# Patient Record
Sex: Male | Born: 1960 | Race: White | Hispanic: No | Marital: Married | Smoking: Never smoker
Health system: Southern US, Community
[De-identification: ages and names within clinical notes are randomized; demographics above are authoritative.]

## PROBLEM LIST (undated history)

## (undated) DIAGNOSIS — N189 Chronic kidney disease, unspecified: Secondary | ICD-10-CM

## (undated) DIAGNOSIS — K5790 Diverticulosis of intestine, part unspecified, without perforation or abscess without bleeding: Secondary | ICD-10-CM

## (undated) DIAGNOSIS — N138 Other obstructive and reflux uropathy: Secondary | ICD-10-CM

## (undated) DIAGNOSIS — E039 Hypothyroidism, unspecified: Secondary | ICD-10-CM

## (undated) DIAGNOSIS — C61 Malignant neoplasm of prostate: Secondary | ICD-10-CM

## (undated) DIAGNOSIS — I10 Essential (primary) hypertension: Secondary | ICD-10-CM

## (undated) DIAGNOSIS — E119 Type 2 diabetes mellitus without complications: Secondary | ICD-10-CM

## (undated) DIAGNOSIS — Z860100 Personal history of colon polyps, unspecified: Secondary | ICD-10-CM

## (undated) DIAGNOSIS — N401 Enlarged prostate with lower urinary tract symptoms: Secondary | ICD-10-CM

## (undated) DIAGNOSIS — R972 Elevated prostate specific antigen [PSA]: Secondary | ICD-10-CM

## (undated) DIAGNOSIS — R351 Nocturia: Secondary | ICD-10-CM

## (undated) DIAGNOSIS — E785 Hyperlipidemia, unspecified: Secondary | ICD-10-CM

## (undated) HISTORY — PX: COLONOSCOPY: SHX174

## (undated) HISTORY — PX: PROSTATE BIOPSY: SHX241

---

## 2006-06-13 ENCOUNTER — Inpatient Hospital Stay (HOSPITAL_COMMUNITY): Admission: EM | Admit: 2006-06-13 | Discharge: 2006-06-13 | Payer: Self-pay | Admitting: Emergency Medicine
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
## 2018-06-04 DIAGNOSIS — N183 Chronic kidney disease, stage 3 unspecified: Secondary | ICD-10-CM | POA: Insufficient documentation

## 2018-06-04 DIAGNOSIS — E1122 Type 2 diabetes mellitus with diabetic chronic kidney disease: Secondary | ICD-10-CM | POA: Insufficient documentation

## 2018-06-04 DIAGNOSIS — R809 Proteinuria, unspecified: Secondary | ICD-10-CM | POA: Insufficient documentation

## 2022-08-14 ENCOUNTER — Other Ambulatory Visit: Payer: Self-pay | Admitting: Urology

## 2022-08-14 DIAGNOSIS — R972 Elevated prostate specific antigen [PSA]: Secondary | ICD-10-CM

## 2022-09-18 ENCOUNTER — Other Ambulatory Visit: Payer: Self-pay | Admitting: Urology

## 2022-09-18 ENCOUNTER — Ambulatory Visit
Admission: RE | Admit: 2022-09-18 | Discharge: 2022-09-18 | Disposition: A | Discharge status: Home / Self Care | Payer: BC Managed Care – PPO | Source: Ambulatory Visit | Attending: Urology | Admitting: Urology

## 2022-09-18 DIAGNOSIS — R972 Elevated prostate specific antigen [PSA]: Secondary | ICD-10-CM

## 2022-09-18 DIAGNOSIS — T1590XA Foreign body on external eye, part unspecified, unspecified eye, initial encounter: Secondary | ICD-10-CM

## 2022-09-18 MED ORDER — GADOPICLENOL 0.5 MMOL/ML IV SOLN
10.0000 mL | Freq: Once | INTRAVENOUS | Status: AC | PRN
  Administered 2022-09-18: 10 mL via INTRAVENOUS

## 2022-11-10 ENCOUNTER — Telehealth: Payer: Self-pay | Admitting: Internal Medicine

## 2022-11-10 DIAGNOSIS — Z86010 Personal history of colonic polyps: Secondary | ICD-10-CM

## 2022-11-10 NOTE — Telephone Encounter (Signed)
I saw that he was recommended to have a routine repeat colonoscopy in 5 years (which would be this year)  However, no details about last colonoscopy in referral info  We need:  1) copy of last colonoscopy  2) copy of pathology from last colonoscopy  Explain to patient that guidelines have changed and that I want to be sure he needs the colonoscopy based upon most recent guidelines  Once I review the info we can decide when to do next colonoscopy  CEG

## 2022-11-10 NOTE — Telephone Encounter (Signed)
Dr. Leone Payor,  We received a referral for this patient for a colonoscopy.  Last colon was done with Dr. Charm Barges in 2019. (Records under media tab in EPIC).  Please review and advise scheduling.    Thanks Christy  Dr. Leone Payor DOD 11/10/22 AM

## 2022-11-24 ENCOUNTER — Inpatient Hospital Stay
Admission: RE | Admit: 2022-11-24 | Discharge: 2022-11-24 | Disposition: A | Discharge status: Home / Self Care | Payer: Self-pay | Source: Ambulatory Visit | Attending: Radiation Oncology | Admitting: Radiation Oncology

## 2022-11-24 ENCOUNTER — Other Ambulatory Visit: Payer: Self-pay | Admitting: Radiation Oncology

## 2022-11-24 DIAGNOSIS — C61 Malignant neoplasm of prostate: Secondary | ICD-10-CM

## 2022-11-25 ENCOUNTER — Encounter: Payer: Self-pay | Admitting: Urology

## 2022-11-25 ENCOUNTER — Encounter: Payer: Self-pay | Admitting: Radiation Oncology

## 2022-11-25 DIAGNOSIS — C61 Malignant neoplasm of prostate: Secondary | ICD-10-CM | POA: Insufficient documentation

## 2022-11-25 NOTE — Progress Notes (Signed)
GU Location of Tumor / Histology: Prostate Ca  If Prostate Cancer, Gleason Score is (3 + 4) and PSA is (5.2 on 08/08/2022)  Biopsies      10/22/2022 Dr.Roberto Chao CT Abdomen and Pelvis without/with Contrast Clinical Data:  New Diagnosis of prostate cancer.  Staging exam.*  Tracking Code:  BO*     09/18/2022 Dr. Debroah Baller MR Prostate with/without Contrast CLINICAL DATA:  Elevated PSA level of 5.9. Biopsy 06/22/2017 reportedly showed high-grade PIN in the right mid gland and base.  R97.20  IMPRESSION: 1. PI-RADS category 3 lesion of the right anterior transition zone in the mid gland. Targeting data sent to [facility]. 2. Sigmoid colon diverticulosis.   Past/Anticipated interventions by urology, if any: NA  Past/Anticipated interventions by medical oncology, if any: NA  Weight changes, if any: No  IPSS:  2 SHIM:  25  Bowel/Bladder complaints, if any:  No  Nausea/Vomiting, if any: No  Pain issues, if any:  0/10  SAFETY ISSUES: Prior radiation? No Pacemaker/ICD? No Possible current pregnancy? Male Is the patient on methotrexate? No  Current Complaints / other details:  No

## 2022-11-26 ENCOUNTER — Encounter: Payer: Self-pay | Admitting: Radiation Oncology

## 2022-11-26 ENCOUNTER — Other Ambulatory Visit: Payer: Self-pay

## 2022-11-26 ENCOUNTER — Ambulatory Visit
Admission: RE | Admit: 2022-11-26 | Discharge: 2022-11-26 | Disposition: A | Discharge status: Home / Self Care | Payer: BC Managed Care – PPO | Source: Ambulatory Visit | Attending: Radiation Oncology | Admitting: Radiation Oncology

## 2022-11-26 VITALS — Ht 73.0 in | Wt 215.0 lb

## 2022-11-26 DIAGNOSIS — C61 Malignant neoplasm of prostate: Secondary | ICD-10-CM

## 2022-11-26 HISTORY — DX: Hyperlipidemia, unspecified: E78.5

## 2022-11-26 HISTORY — DX: Essential (primary) hypertension: I10

## 2022-11-26 HISTORY — DX: Other obstructive and reflux uropathy: N40.1

## 2022-11-26 HISTORY — DX: Hypothyroidism, unspecified: E03.9

## 2022-11-26 HISTORY — DX: Chronic kidney disease, unspecified: N18.9

## 2022-11-26 HISTORY — DX: Type 2 diabetes mellitus without complications: E11.9

## 2022-11-26 HISTORY — DX: Personal history of colon polyps, unspecified: Z86.0100

## 2022-11-26 HISTORY — DX: Elevated prostate specific antigen (PSA): R97.20

## 2022-11-26 HISTORY — DX: Nocturia: R35.1

## 2022-11-26 HISTORY — DX: Malignant neoplasm of prostate: C61

## 2022-11-26 HISTORY — DX: Diverticulosis of intestine, part unspecified, without perforation or abscess without bleeding: K57.90

## 2022-11-26 HISTORY — DX: Personal history of colonic polyps: Z86.010

## 2022-11-26 HISTORY — DX: Benign prostatic hyperplasia with lower urinary tract symptoms: N13.8

## 2022-11-26 NOTE — Progress Notes (Signed)
Radiation Oncology         [phone] ________________________________  Initial Outpatient Consultation  Name: Micheal Kerr. MRN: [id number]  Date: 11/26/2022  DOB: 01/26/1961  CC:[name], NP  Debroah Baller, MD   REFERRING PHYSICIAN: Debroah Baller, MD  DIAGNOSIS: 62 y.o. gentleman with Stage T1c adenocarcinoma of the prostate with Gleason score of 3+4, and PSA of 5.2.    ICD-10-CM   1. Malignant neoplasm of prostate (HCC)  C61       HISTORY OF PRESENT ILLNESS: Micheal Kerr. is a 62 y.o. male with a diagnosis of prostate cancer. He has a history of elevated, fluctuating PSA since at least 2016 when his PSA was noted at 4.63, and he had a prior prostate biopsy in 06/2017 that was negative, PSA 4.4 at that time. The PSA had remained between 2.5 - 4.7 until 05/2022 when he was noted to have an elevated PSA of 5.93 on routine labs with his primary care provider, Verner Mould, NP.  Accordingly, he was referred for evaluation in urology by Dr. Saddie Benders on 08/08/2022,  digital rectal examination performed at that time showed no discrete nodules or concerning findings.  A repeat PSA that day remained elevated at 5.2 so an MRI prostate was performed on 09/18/22 for further evaluation and this showed a PI-RADS 3 lesion in the right anterior mid gland transitional zone.  He proceeded to MRI fusion transrectal ultrasound with Dr. Arita Miss at [facility] on 10/03/2022.  The prostate volume measured 36 cc.  Out of 15 core biopsies, 7 were positive.  The maximum Gleason score was 3+4, and this was seen in the right mid gland.  Additionally, Gleason 3+3 was seen in the left mid, right base, right base lateral, right apex lateral and 2 out of 3 samples taken from the MRI ROI.  The patient reviewed the biopsy results with his urologist and he has kindly been referred today for discussion of potential radiation treatment options.   PREVIOUS RADIATION THERAPY: No  PAST MEDICAL HISTORY:  Past  Medical History:  Diagnosis Date   BPH with obstruction/lower urinary tract symptoms    Chronic kidney disease    Diabetes mellitus (HCC)    Diverticulosis    Elevated PSA    Hx of colonic polyps    Hyperlipidemia    Hypertension    Hypothyroidism    Nocturia    Prostate cancer (HCC)       PAST SURGICAL HISTORY: Past Surgical History:  Procedure Laterality Date   PROSTATE BIOPSY      FAMILY HISTORY:  Family History  Problem Relation Age of Onset   Lung cancer Mother    Diabetes Father    Diabetes Brother     SOCIAL HISTORY:  Social History   Socioeconomic History   Marital status: Married    Spouse name: Not on file   Number of children: 2   Years of education: Not on file   Highest education level: Not on file  Occupational History   Not on file  Tobacco Use   Smoking status: Never   Smokeless tobacco: Never  Substance and Sexual Activity   Alcohol use: Never   Drug use: Never   Sexual activity: Not on file  Other Topics Concern   Not on file  Social History Narrative   Not on file   Social Determinants of Health   Financial Resource Strain: Not on file  Food Insecurity: No Food Insecurity (11/26/2022)   Hunger  Vital Sign    Worried About Programme researcher, broadcasting/film/video in the Last Year: Never true    Ran Out of Food in the Last Year: Never true  Transportation Needs: No Transportation Needs (11/26/2022)   PRAPARE - Administrator, Civil Service (Medical): No    Lack of Transportation (Non-Medical): No  Physical Activity: Not on file  Stress: Not on file  Social Connections: Not on file  Intimate Partner Violence: Not At Risk (11/26/2022)   Humiliation, Afraid, Rape, and Kick questionnaire    Fear of Current or Ex-Partner: No    Emotionally Abused: No    Physically Abused: No    Sexually Abused: No    ALLERGIES: Patient has no known allergies.  MEDICATIONS:  Current Outpatient Medications  Medication Sig Dispense Refill   finasteride (PROSCAR)  5 MG tablet Take by mouth.     JARDIANCE 25 MG TABS tablet Take by mouth.     levothyroxine (SYNTHROID) 75 MCG tablet Take 1 tablet by mouth daily.     lisinopril (ZESTRIL) 10 MG tablet Take by mouth.     metFORMIN (GLUCOPHAGE) 1000 MG tablet Take 1 tablet by mouth 2 (two) times daily.     MOUNJARO 12.5 MG/0.5ML Pen Inject into the skin.     rosuvastatin (CRESTOR) 10 MG tablet      No current facility-administered medications for this encounter.    REVIEW OF SYSTEMS:  On review of systems, the patient reports that he is doing well overall. He denies any chest pain, shortness of breath, cough, fevers, chills, night sweats, unintended weight changes. He denies any bowel disturbances, and denies abdominal pain, nausea or vomiting. He denies any new musculoskeletal or joint aches or pains. His IPSS was 2, indicating mild urinary symptoms. His SHIM was 25, indicating he does not have erectile dysfunction. A complete review of systems is obtained and is otherwise negative.    PHYSICAL EXAM:  Wt Readings from Last 3 Encounters:  11/26/22 215 lb (97.5 kg)   Temp Readings from Last 3 Encounters:  No data found for Temp   BP Readings from Last 3 Encounters:  No data found for BP   Pulse Readings from Last 3 Encounters:  No data found for Pulse   Pain Assessment Pain Score: 0-No pain/10  Deferred, visit conducted via telephone.    KPS = 100  100 - Normal; no complaints; no evidence of disease. 90   - Able to carry on normal activity; minor signs or symptoms of disease. 80   - Normal activity with effort; some signs or symptoms of disease. 55   - Cares for self; unable to carry on normal activity or to do active work. 60   - Requires occasional assistance, but is able to care for most of his personal needs. 50   - Requires considerable assistance and frequent medical care. 40   - Disabled; requires special care and assistance. 30   - Severely disabled; hospital admission is indicated  although death not imminent. 20   - Very sick; hospital admission necessary; active supportive treatment necessary. 10   - Moribund; fatal processes progressing rapidly. 0     - Dead  Karnofsky DA, Abelmann WH, Craver LS and Burchenal JH [phone]) The use of the nitrogen mustards in the palliative treatment of carcinoma: with particular reference to bronchogenic carcinoma Cancer 1 634-56  LABORATORY DATA:  No results found for: "WBC", "HGB", "HCT", "MCV", "PLT" No results found for: "NA", "K", "CL", "  CO2" No results found for: "ALT", "AST", "GGT", "ALKPHOS", "BILITOT"   RADIOGRAPHY: No results found.    IMPRESSION/PLAN: 1. 63 y.o. gentleman with Stage T1c adenocarcinoma of the prostate with Gleason Score of 3+4, and PSA of 5.2. We discussed the patient's workup and outlined the nature of prostate cancer in this setting. The patient's T stage, Gleason's score, and PSA put him into the favorable intermediate risk group. Accordingly, he is eligible for a variety of potential treatment options including brachytherapy, 5.5 weeks of external radiation, or prostatectomy. We discussed the available radiation techniques, and focused on the details and logistics of delivery.  We discussed and outlined the risks, benefits, short and long-term effects associated with radiotherapy and compared and contrasted these with prostatectomy. We discussed the role of SpaceOAR gel in reducing the rectal toxicity associated with radiotherapy.  He appears to have a good understanding of his disease and our treatment recommendations which are of curative intent.  He was encouraged to ask questions that were answered to his stated satisfaction.  At the conclusion of our conversation, the patient is interested in moving forward with brachytherapy. 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 We will have our scheduling team work with Dr. Cindee Salt to schedule a  brachytherapy OR time.   We personally spent 45 minutes in this encounter including chart review, reviewing radiological studies, meeting face-to-face with the patient, entering orders and completing documentation.    Joyice Faster, PA-C    Margaretmary Dys, MD  [facility]  Radiation Oncology Direct Dial: [phone]  Fax: [phone] [email or web address]  Skype  LinkedIn

## 2022-11-28 ENCOUNTER — Encounter: Payer: Self-pay | Admitting: Radiation Oncology

## 2022-12-10 NOTE — Telephone Encounter (Signed)
Good morning Dr. Leone Payor,   We received the colonoscopy report from 2019, however no pathology report was done with previous colonoscopy. Report from 2016 was also scanned into Media under "[facility] transfer of care Records."  Would you please review and advise on colonoscopy?  Thank you.

## 2022-12-11 ENCOUNTER — Encounter: Payer: Self-pay | Admitting: Internal Medicine

## 2022-12-11 DIAGNOSIS — Z860100 Personal history of colon polyps, unspecified: Secondary | ICD-10-CM | POA: Insufficient documentation

## 2022-12-11 NOTE — Telephone Encounter (Signed)
OK to schedule direct colonoscopy  Encounter Diagnosis  Name Primary?   Hx of colonic polyps Yes

## 2022-12-12 ENCOUNTER — Telehealth: Payer: Self-pay | Admitting: *Deleted

## 2022-12-12 NOTE — Telephone Encounter (Signed)
Left voicemail for patient to call back and schedule colonoscopy.

## 2022-12-12 NOTE — Telephone Encounter (Signed)
Called patient to inform of pre-seed appts. and implant date, spoke with patient and he is aware of these appts. 

## 2022-12-17 ENCOUNTER — Telehealth: Payer: Self-pay | Admitting: *Deleted

## 2022-12-17 NOTE — Telephone Encounter (Signed)
CALLED PATIENT TO REMIND OF PRE-SEED APPTS. FOR 12-18-22, SPOKE WITH PATIENT AND HE IS AWARE OF THESE APPTS.

## 2022-12-17 NOTE — Progress Notes (Signed)
  Radiation Oncology         [phone]) [phone] ________________________________  Name: Micheal Kerr. MRN: [id number]  Date: 12/18/2022  DOB: Jul 15, 1961  SIMULATION AND TREATMENT PLANNING NOTE PUBIC ARCH STUDY  WJ:XBJYN, Orie Rout, NP  Debroah Baller, MD  DIAGNOSIS: 62 y.o. gentleman with Stage T1c adenocarcinoma of the prostate with Gleason score of 3+4, and PSA of 5.2.   Oncology History  Malignant neoplasm of prostate (HCC)  10/03/2022 Cancer Staging   Staging form: Prostate, AJCC 8th Edition - Clinical stage from 10/03/2022: Stage IIB (cT1c, cN0, cM0, PSA: 5.2, Grade Group: 2) - Signed by Marcello Fennel, PA-C on 11/25/2022 Histopathologic type: Adenocarcinoma, NOS Stage prefix: Initial diagnosis Prostate specific antigen (PSA) range: Less than 10 Gleason primary pattern: 3 Gleason secondary pattern: 4 Gleason score: 7 Histologic grading system: 5 grade system Number of biopsy cores examined: 15 Number of biopsy cores positive: 7 Location of positive needle core biopsies: Both sides   11/25/2022 Initial Diagnosis   Malignant neoplasm of prostate (HCC)       ICD-10-CM   1. Malignant neoplasm of prostate (HCC)  C61       COMPLEX SIMULATION:  The patient presented today for evaluation for possible prostate seed implant. He was brought to the radiation planning suite and placed supine on the CT couch. A 3-dimensional image study set was obtained in upload to the planning computer. There, on each axial slice, I contoured the prostate gland. Then, using three-dimensional radiation planning tools I reconstructed the prostate in view of the structures from the transperineal needle pathway to assess for possible pubic arch interference. In doing so, I did not appreciate any pubic arch interference. Also, the patient's prostate volume was estimated based on the drawn structure. The volume was 36 cc.  Given the pubic arch appearance and prostate volume, patient remains a good candidate to  proceed with prostate seed implant. Today, he freely provided informed written consent to proceed.    PLAN: The patient will undergo prostate seed implant.   ________________________________  Artist Pais. Kathrynn Running, M.D.

## 2022-12-17 NOTE — Progress Notes (Signed)
Radiation Oncology         [phone]) [phone] ________________________________  Outpatient Follow up- Pre-seed visit  Name: Micheal Kerr. MRN: [id number]  Date: 12/18/2022  DOB: 14-Sep-1960  CC:[name], NP  Debroah Baller, MD   REFERRING PHYSICIAN: Debroah Baller, MD  DIAGNOSIS: 62 y.o. gentleman with Stage T1c adenocarcinoma of the prostate with Gleason score of 3+4, and PSA of 5.2.     ICD-10-CM   1. Malignant neoplasm of prostate (HCC)  C61       HISTORY OF PRESENT ILLNESS: Micheal Kerr. is a 62 y.o. male with a diagnosis of prostate cancer.  He has a history of elevated, fluctuating PSA since at least 2016 when his PSA was noted at 4.63, and he had a prior prostate biopsy in 06/2017 that was negative, PSA 4.4 at that time. The PSA had remained between 2.5 - 4.7 until 05/2022 when he was noted to have an elevated PSA of 5.93 on routine labs with his primary care provider, Verner Mould, NP.  Accordingly, he was referred for evaluation in urology by Dr. Saddie Benders on 08/08/2022,  digital rectal examination performed at that time showed no discrete nodules or concerning findings.  A repeat PSA that day remained elevated at 5.2 so an MRI prostate was performed on 09/18/22 for further evaluation and this showed a PI-RADS 3 lesion in the right anterior mid gland transitional zone.  He proceeded to MRI fusion transrectal ultrasound with Dr. Arita Miss at [facility] on 10/03/2022.  The prostate volume measured 36 cc.  Out of 15 core biopsies, 7 were positive.  The maximum Gleason score was 3+4, and this was seen in the right mid gland.  Additionally, Gleason 3+3 was seen in the left mid, right base, right base lateral, right apex lateral and 2 out of 3 samples taken from the MRI ROI.   The patient reviewed the biopsy results with his urologist and was kindly referred to [place] for discussion of potential radiation treatment options. We initially met the patient on 11/26/22 and he was most interested  in proceeding with brachytherapy and SpaceOAR gel placement for treatment of his disease. He is here today for his pre-procedure imaging for planning and to answer any additional questions he may have about this treatment.   PREVIOUS RADIATION THERAPY: No  PAST MEDICAL HISTORY:  Past Medical History:  Diagnosis Date   BPH with obstruction/lower urinary tract symptoms    Chronic kidney disease    Diabetes mellitus (HCC)    Diverticulosis    Elevated PSA    Hx of colonic polyps    Hyperlipidemia    Hypertension    Hypothyroidism    Nocturia    Prostate cancer (HCC)       PAST SURGICAL HISTORY: Past Surgical History:  Procedure Laterality Date   PROSTATE BIOPSY      FAMILY HISTORY:  Family History  Problem Relation Age of Onset   Lung cancer Mother    Diabetes Father    Diabetes Brother     SOCIAL HISTORY:  Social History   Socioeconomic History   Marital status: Married    Spouse name: Not on file   Number of children: 2   Years of education: Not on file   Highest education level: Not on file  Occupational History   Not on file  Tobacco Use   Smoking status: Never   Smokeless tobacco: Never  Substance and Sexual Activity   Alcohol use: Never   Drug  use: Never   Sexual activity: Not on file  Other Topics Concern   Not on file  Social History Narrative   Not on file   Social Determinants of Health   Financial Resource Strain: Not on file  Food Insecurity: No Food Insecurity (11/26/2022)   Hunger Vital Sign    Worried About Running Out of Food in the Last Year: Never true    Ran Out of Food in the Last Year: Never true  Transportation Needs: No Transportation Needs (11/26/2022)   PRAPARE - Administrator, Civil Service (Medical): No    Lack of Transportation (Non-Medical): No  Physical Activity: Not on file  Stress: Not on file  Social Connections: Not on file  Intimate Partner Violence: Not At Risk (11/26/2022)   Humiliation, Afraid, Rape, and  Kick questionnaire    Fear of Current or Ex-Partner: No    Emotionally Abused: No    Physically Abused: No    Sexually Abused: No    ALLERGIES: Patient has no known allergies.  MEDICATIONS:  Current Outpatient Medications  Medication Sig Dispense Refill   finasteride (PROSCAR) 5 MG tablet Take by mouth.     JARDIANCE 25 MG TABS tablet Take by mouth.     levothyroxine (SYNTHROID) 75 MCG tablet Take 1 tablet by mouth daily.     lisinopril (ZESTRIL) 10 MG tablet Take by mouth.     metFORMIN (GLUCOPHAGE) 1000 MG tablet Take 1 tablet by mouth 2 (two) times daily.     MOUNJARO 12.5 MG/0.5ML Pen Inject into the skin.     rosuvastatin (CRESTOR) 10 MG tablet      No current facility-administered medications for this visit.    REVIEW OF SYSTEMS:  On review of systems, the patient reports that he is doing well overall. He denies any chest pain, shortness of breath, cough, fevers, chills, night sweats, unintended weight changes. He denies any bowel disturbances, and denies abdominal pain, nausea or vomiting. He denies any new musculoskeletal or joint aches or pains. His IPSS was 2, indicating mild urinary symptoms. His SHIM was 25, indicating he does not have erectile dysfunction. A complete review of systems is obtained and is otherwise negative.  PHYSICAL EXAM:  Wt Readings from Last 3 Encounters:  11/26/22 215 lb (97.5 kg)   Temp Readings from Last 3 Encounters:  No data found for Temp   BP Readings from Last 3 Encounters:  No data found for BP   Pulse Readings from Last 3 Encounters:  No data found for Pulse    /10  In general this is a well appearing Caucasian male in no acute distress. He's alert and oriented x4 and appropriate throughout the examination. Cardiopulmonary assessment is negative for acute distress, and he exhibits normal effort.     KPS = 100  100 - Normal; no complaints; no evidence of disease. 90   - Able to carry on normal activity; minor signs or symptoms of  disease. 80   - Normal activity with effort; some signs or symptoms of disease. 83   - Cares for self; unable to carry on normal activity or to do active work. 60   - Requires occasional assistance, but is able to care for most of his personal needs. 50   - Requires considerable assistance and frequent medical care. 40   - Disabled; requires special care and assistance. 30   - Severely disabled; hospital admission is indicated although death not imminent. 20   - Very  sick; hospital admission necessary; active supportive treatment necessary. 10   - Moribund; fatal processes progressing rapidly. 0     - Dead  Karnofsky DA, Abelmann WH, Craver LS and Burchenal JH [phone]) The use of the nitrogen mustards in the palliative treatment of carcinoma: with particular reference to bronchogenic carcinoma Cancer 1 634-56  LABORATORY DATA:  No results found for: "WBC", "HGB", "HCT", "MCV", "PLT" No results found for: "NA", "K", "CL", "CO2" No results found for: "ALT", "AST", "GGT", "ALKPHOS", "BILITOT"   RADIOGRAPHY: No results found.    IMPRESSION/PLAN: 1. 62 y.o. gentleman with Stage T1c adenocarcinoma of the prostate with Gleason score of 3+4, and PSA of 5.2.  The patient has elected to proceed with seed implant for treatment of his disease. We reviewed the risks, benefits, short and long-term effects associated with brachytherapy and discussed the role of SpaceOAR in reducing the rectal toxicity associated with radiotherapy.  He appears to have a good understanding of his disease and our treatment recommendations which are of curative intent.  He was encouraged to ask questions that were answered to his stated satisfaction. He has freely signed written consent to proceed today in the office and a copy of this document will be placed in his medical record. His procedure is tentatively scheduled for 02/19/23 in collaboration with Dr. Saddie Benders and we will see him back for his post-procedure visit approximately 3  weeks thereafter. We look forward to continuing to participate in his care. He knows that he is welcome to call with any questions or concerns at any time in the interim.  I personally spent 20 minutes in this encounter including chart review, reviewing radiological studies, meeting face-to-face with the patient, entering orders and completing documentation.    Marguarite Arbour, MMS, PA-C [place]  [facility] Radiation Oncology Physician Assistant Direct Dial: [phone]  Fax: [phone]

## 2022-12-18 ENCOUNTER — Other Ambulatory Visit (HOSPITAL_COMMUNITY): Payer: BC Managed Care – PPO

## 2022-12-18 ENCOUNTER — Ambulatory Visit
Admission: RE | Admit: 2022-12-18 | Discharge: 2022-12-18 | Disposition: A | Discharge status: Home / Self Care | Payer: BC Managed Care – PPO | Source: Ambulatory Visit | Attending: Radiation Oncology | Admitting: Radiation Oncology

## 2022-12-18 ENCOUNTER — Ambulatory Visit
Admission: RE | Admit: 2022-12-18 | Discharge: 2022-12-18 | Disposition: A | Discharge status: Home / Self Care | Payer: BC Managed Care – PPO | Source: Ambulatory Visit | Attending: Urology | Admitting: Urology

## 2022-12-18 ENCOUNTER — Encounter (HOSPITAL_COMMUNITY)
Admission: RE | Admit: 2022-12-18 | Discharge: 2022-12-18 | Disposition: A | Discharge status: Home / Self Care | Payer: BC Managed Care – PPO | Source: Ambulatory Visit | Attending: Urology | Admitting: Urology

## 2022-12-18 ENCOUNTER — Encounter: Payer: Self-pay | Admitting: Urology

## 2022-12-18 VITALS — Resp 19 | Ht 73.0 in | Wt 221.0 lb

## 2022-12-18 DIAGNOSIS — C61 Malignant neoplasm of prostate: Secondary | ICD-10-CM

## 2022-12-18 DIAGNOSIS — Z01810 Encounter for preprocedural cardiovascular examination: Secondary | ICD-10-CM | POA: Insufficient documentation

## 2022-12-18 DIAGNOSIS — Z01818 Encounter for other preprocedural examination: Secondary | ICD-10-CM

## 2022-12-18 NOTE — Progress Notes (Signed)
Pre-seed nursing interview for a diagnosis of Stage T1c adenocarcinoma of the prostate with Gleason score of 3+4, and PSA of 5.2.  Patient identity verified x2. Patient reports doing well. No issues conveyed at this time.  Meaningful use complete.  Urinary Management medication(s)- None currently. Urology appointment date- 12/2022, with Dr. Saddie Benders at [facility]  Resp 19   Ht 6\' 1"  (1.854 m)   Wt 221 lb (100.2 kg)   BMI 29.16 kg/m   This concludes the interview.   Ruel Favors, LPN

## 2022-12-24 ENCOUNTER — Encounter: Payer: Self-pay | Admitting: Internal Medicine

## 2023-01-01 ENCOUNTER — Ambulatory Visit (AMBULATORY_SURGERY_CENTER): Payer: BC Managed Care – PPO

## 2023-01-01 ENCOUNTER — Encounter: Payer: Self-pay | Admitting: Internal Medicine

## 2023-01-01 VITALS — Ht 73.0 in | Wt 220.0 lb

## 2023-01-01 DIAGNOSIS — Z86010 Personal history of colonic polyps: Secondary | ICD-10-CM

## 2023-01-01 MED ORDER — NA SULFATE-K SULFATE-MG SULF 17.5-3.13-1.6 GM/177ML PO SOLN
1.0000 | Freq: Once | ORAL | 0 refills | Status: AC
Start: 2023-01-01 — End: 2023-01-01

## 2023-01-01 NOTE — Progress Notes (Signed)
No egg or soy allergy known to patient  No issues known to pt with past sedation with any surgeries or procedures Patient denies ever being told they had issues or difficulty with intubation  No FH of Malignant Hyperthermia Pt is not on diet pills Pt is not on  home 02  Pt is not on blood thinners  Pt denies issues with constipation  No A fib or A flutter Have any cardiac testing pending-- no  LOA: independent   PV completed. Prep instructions reviewed with patient. Help to acess mychart given in the even packet does reach the home address before procedure date. Pt to call the office back if there are any questions. Goodrx coupon for [facility] provided .  Pt instructed to use Singlecare.com or GoodRx for a price reduction on prep if needed   Patient's chart reviewed by Cathlyn Parsons CNRA prior to previsit and patient appropriate for the [facility].  Previsit completed and red dot placed by patient's name on their procedure day (on provider's schedule).

## 2023-01-02 ENCOUNTER — Encounter: Payer: Self-pay | Admitting: Cardiology

## 2023-01-08 ENCOUNTER — Encounter: Payer: Self-pay | Admitting: Certified Registered Nurse Anesthetist

## 2023-01-14 ENCOUNTER — Encounter: Payer: Self-pay | Admitting: Internal Medicine

## 2023-01-14 ENCOUNTER — Ambulatory Visit (AMBULATORY_SURGERY_CENTER): Payer: BC Managed Care – PPO | Admitting: Internal Medicine

## 2023-01-14 VITALS — BP 107/67 | HR 84 | Temp 98.7°F | Resp 9 | Ht 73.0 in | Wt 220.0 lb

## 2023-01-14 DIAGNOSIS — D123 Benign neoplasm of transverse colon: Secondary | ICD-10-CM | POA: Diagnosis not present

## 2023-01-14 DIAGNOSIS — Z09 Encounter for follow-up examination after completed treatment for conditions other than malignant neoplasm: Secondary | ICD-10-CM | POA: Diagnosis not present

## 2023-01-14 DIAGNOSIS — Z86010 Personal history of colonic polyps: Secondary | ICD-10-CM

## 2023-01-14 MED ORDER — SODIUM CHLORIDE 0.9 % IV SOLN
500.0000 mL | Freq: Once | INTRAVENOUS | Status: DC
Start: 2023-01-14 — End: 2023-01-14

## 2023-01-14 NOTE — Op Note (Signed)
[facility] Patient Name: Micheal Kerr Procedure Date: 01/14/2023 1:40 PM MRN: [id number] Endoscopist: Iva Boop , MD, [id number] Age: 62 Referring MD:  Date of Birth: [birth year] Gender: Male Account #: [id number] Procedure:                Colonoscopy Indications:              Surveillance: Personal history of adenomatous                            polyps on last colonoscopy 5 years ago, Last                            colonoscopy: 2019 Medicines:                Monitored Anesthesia Care Procedure:                Pre-Anesthesia Assessment:                           - Prior to the procedure, a History and Physical                            was performed, and patient medications and                            allergies were reviewed. The patient's tolerance of                            previous anesthesia was also reviewed. The risks                            and benefits of the procedure and the sedation                            options and risks were discussed with the patient.                            All questions were answered, and informed consent                            was obtained. Prior Anticoagulants: The patient has                            taken no anticoagulant or antiplatelet agents. ASA                            Grade Assessment: II - A patient with mild systemic                            disease. After reviewing the risks and benefits,                            the patient was deemed in satisfactory condition to  undergo the procedure.                           After obtaining informed consent, the colonoscope                            was passed under direct vision. Throughout the                            procedure, the patient's blood pressure, pulse, and                            oxygen saturations were monitored continuously. The                            CF [id number] #[id number] was introduced through the  anus                            and advanced to the the cecum, identified by                            appendiceal orifice and ileocecal valve. The                            colonoscopy was performed without difficulty. The                            patient tolerated the procedure well. The quality                            of the bowel preparation was good. The ileocecal                            valve, appendiceal orifice, and rectum were                            photographed. The bowel preparation used was SUPREP                            via split dose instruction. Scope In: 2:03:02 PM Scope Out: 2:15:05 PM Scope Withdrawal Time: 0 hours 8 minutes 15 seconds  Total Procedure Duration: 0 hours 12 minutes 3 seconds  Findings:                 The perianal and digital rectal examinations were                            normal. Pertinent negatives include normal prostate                            (size, shape, and consistency).                           Four sessile polyps were found in the transverse  colon. The polyps were diminutive in size. These                            polyps were removed with a cold snare. Resection                            and retrieval were complete. Verification of                            patient identification for the specimen was done.                            Estimated blood loss was minimal.                           Multiple diverticula were found in the sigmoid                            colon.                           The exam was otherwise without abnormality on                            direct and retroflexion views. Complications:            No immediate complications. Estimated Blood Loss:     Estimated blood loss was minimal. Impression:               - Four diminutive polyps in the transverse colon,                            removed with a cold snare. Resected and retrieved.                           -  Diverticulosis in the sigmoid colon.                           - The examination was otherwise normal on direct                            and retroflexion views.                           - Personal history of colonic polyps. Multiple some                            > 10 mm 2016 (no path report) and no polyps 2019 Recommendation:           - Patient has a contact number available for                            emergencies. The signs and symptoms of potential  delayed complications were discussed with the                            patient. Return to normal activities tomorrow.                            Written discharge instructions were provided to the                            patient.                           - Resume previous diet.                           - Continue present medications.                           - Repeat colonoscopy is recommended for                            surveillance. The colonoscopy date will be                            determined after pathology results from today's                            exam become available for review. Iva Boop, MD 01/14/2023 2:26:11 PM This report has been signed electronically.

## 2023-01-14 NOTE — Progress Notes (Signed)
Report given to PACU, vss 

## 2023-01-14 NOTE — Progress Notes (Signed)
[facility] History and Physical   Primary Care Physician:  [name], NP   Reason for Procedure:   Hx colon polyps  Plan:    colonoscopy     HPI: Micheal Kerr. is a 62 y.o. male here for a surveillance colonoscopy  2016 - Charm Barges multiple some > 10 mm - no path report Neg exam 2019  Past Medical History:  Diagnosis Date   BPH with obstruction/lower urinary tract symptoms    Chronic kidney disease    Diabetes mellitus (HCC)    Diverticulosis    Elevated PSA    Hx of colonic polyps    Hyperlipidemia    Hypertension    Hypothyroidism    Nocturia    Prostate cancer (HCC)     Past Surgical History:  Procedure Laterality Date   COLONOSCOPY     PROSTATE BIOPSY      Prior to Admission medications   Medication Sig Start Date End Date Taking? Authorizing Provider  JARDIANCE 25 MG TABS tablet Take by mouth. 04/27/18  Yes [provider]  levothyroxine (SYNTHROID) 88 MCG tablet Take 88 mcg by mouth every morning. 12/16/22  Yes [provider]  losartan (COZAAR) 25 MG tablet Take 25 mg by mouth daily. 12/12/22  Yes [provider]  metFORMIN (GLUCOPHAGE) 1000 MG tablet Take 1 tablet by mouth 2 (two) times daily. 04/23/18  Yes [provider]  rosuvastatin (CRESTOR) 10 MG tablet  08/30/22  Yes [provider]  MOUNJARO 12.5 MG/0.5ML Pen Inject 12.5 mg into the skin once a week. 09/16/22   [provider]    Current Outpatient Medications  Medication Sig Dispense Refill   JARDIANCE 25 MG TABS tablet Take by mouth.     levothyroxine (SYNTHROID) 88 MCG tablet Take 88 mcg by mouth every morning.     losartan (COZAAR) 25 MG tablet Take 25 mg by mouth daily.     metFORMIN (GLUCOPHAGE) 1000 MG tablet Take 1 tablet by mouth 2 (two) times daily.     rosuvastatin (CRESTOR) 10 MG tablet      MOUNJARO 12.5 MG/0.5ML Pen Inject 12.5 mg into the skin once a week.     Current Facility-Administered Medications   Medication Dose Route Frequency Provider Last Rate Last Admin   0.9 %  sodium chloride infusion  500 mL Intravenous Once Iva Boop, MD        Allergies as of 01/14/2023   (No Known Allergies)    Family History  Problem Relation Age of Onset   Lung cancer Mother    Diabetes Father    Diabetes Brother    Colon cancer Neg Hx    Colon polyps Neg Hx    Esophageal cancer Neg Hx    Rectal cancer Neg Hx    Stomach cancer Neg Hx     Social History   Socioeconomic History   Marital status: Married    Spouse name: Not on file   Number of children: 2   Years of education: Not on file   Highest education level: Not on file  Occupational History   Not on file  Tobacco Use   Smoking status: Never   Smokeless tobacco: Never  Vaping Use   Vaping Use: Never used  Substance and Sexual Activity   Alcohol use: Never   Drug use: Never   Sexual activity: Not on file  Other Topics Concern   Not on file  Social History Narrative   Not on  file   Social Determinants of Health   Financial Resource Strain: Not on file  Food Insecurity: No Food Insecurity (11/26/2022)   Hunger Vital Sign    Worried About Running Out of Food in the Last Year: Never true    Ran Out of Food in the Last Year: Never true  Transportation Needs: No Transportation Needs (11/26/2022)   PRAPARE - Administrator, Civil Service (Medical): No    Lack of Transportation (Non-Medical): No  Physical Activity: Not on file  Stress: Not on file  Social Connections: Not on file  Intimate Partner Violence: Not At Risk (11/26/2022)   Humiliation, Afraid, Rape, and Kick questionnaire    Fear of Current or Ex-Partner: No    Emotionally Abused: No    Physically Abused: No    Sexually Abused: No    Review of Systems:  All other review of systems negative except as mentioned in the HPI.  Physical Exam: Vital signs BP 119/75   Pulse 87   Temp 98.7 F (37.1 C)   Ht 6\' 1"  (1.854 m)   Wt 220 lb (99.8 kg)    SpO2 96%   BMI 29.03 kg/m   General:   Alert,  Well-developed, well-nourished, pleasant and cooperative in NAD Lungs:  Clear throughout to auscultation.   Heart:  Regular rate and rhythm; no murmurs, clicks, rubs,  or gallops. Abdomen:  Soft, nontender and nondistended. Normal bowel sounds.   Neuro/Psych:  Alert and cooperative. Normal mood and affect. A and O x 3   @Yelena Metzer  Sena Slate, MD, [facility] [phone] (pager) 01/14/2023 1:48 PM@

## 2023-01-14 NOTE — Progress Notes (Signed)
Called to room to assist during endoscopic procedure.  Patient ID and intended procedure confirmed with present staff. Received instructions for my participation in the procedure from the performing physician.  

## 2023-01-14 NOTE — Patient Instructions (Addendum)
I found and removed 4 tiny polyps. I will let you know pathology results and when to have another routine colonoscopy by mail and/or My Chart.  You also have diverticulosis - a common condition that is not usually a problem.  I appreciate the opportunity to care for you. Iva Boop, MD, [facility]  Handouts Provided:  Polyps and Diverticulosis  YOU HAD AN ENDOSCOPIC PROCEDURE TODAY AT THE [facility]:   Refer to the procedure report that was given to you for any specific questions about what was found during the examination.  If the procedure report does not answer your questions, please call your gastroenterologist to clarify.  If you requested that your care partner not be given the details of your procedure findings, then the procedure report has been included in a sealed envelope for you to review at your convenience later.  YOU SHOULD EXPECT: Some feelings of bloating in the abdomen. Passage of more gas than usual.  Walking can help get rid of the air that was put into your GI tract during the procedure and reduce the bloating. If you had a lower endoscopy (such as a colonoscopy or flexible sigmoidoscopy) you may notice spotting of blood in your stool or on the toilet paper. If you underwent a bowel prep for your procedure, you may not have a normal bowel movement for a few days.  Please Note:  You might notice some irritation and congestion in your nose or some drainage.  This is from the oxygen used during your procedure.  There is no need for concern and it should clear up in a day or so.  SYMPTOMS TO REPORT IMMEDIATELY:  Following lower endoscopy (colonoscopy or flexible sigmoidoscopy):  Excessive amounts of blood in the stool  Significant tenderness or worsening of abdominal pains  Swelling of the abdomen that is new, acute  Fever of 100F or higher  For urgent or emergent issues, a gastroenterologist can be reached at any hour by calling [phone]. Do not use  MyChart messaging for urgent concerns.    DIET:  We do recommend a small meal at first, but then you may proceed to your regular diet.  Drink plenty of fluids but you should avoid alcoholic beverages for 24 hours.  ACTIVITY:  You should plan to take it easy for the rest of today and you should NOT DRIVE or use heavy machinery until tomorrow (because of the sedation medicines used during the test).    FOLLOW UP: Our staff will call the number listed on your records the next business day following your procedure.  We will call around 7:15- 8:00 am to check on you and address any questions or concerns that you may have regarding the information given to you following your procedure. If we do not reach you, we will leave a message.     If any biopsies were taken you will be contacted by phone or by letter within the next 1-3 weeks.  Please call us at [phone] if you have not heard about the biopsies in 3 weeks.    SIGNATURES/CONFIDENTIALITY: You and/or your care partner have signed paperwork which will be entered into your electronic medical record.  These signatures attest to the fact that that the information above on your After Visit Summary has been reviewed and is understood.  Full responsibility of the confidentiality of this discharge information lies with you and/or your care-partner.

## 2023-01-14 NOTE — Progress Notes (Signed)
Pt's states no medical or surgical changes since previsit or office visit. 

## 2023-01-15 ENCOUNTER — Telehealth: Payer: Self-pay | Admitting: *Deleted

## 2023-01-15 NOTE — Telephone Encounter (Signed)
Post procedure follow up phone call. No answer at number given.  Left message on voicemail.  

## 2023-01-26 ENCOUNTER — Encounter: Payer: Self-pay | Admitting: Internal Medicine

## 2023-01-29 ENCOUNTER — Other Ambulatory Visit: Payer: Self-pay

## 2023-01-29 ENCOUNTER — Encounter (HOSPITAL_BASED_OUTPATIENT_CLINIC_OR_DEPARTMENT_OTHER): Payer: Self-pay | Admitting: Urology

## 2023-01-29 NOTE — Progress Notes (Signed)
Spoke w/ via phone for pre-op interview: patient  Lab needs dos: I-Stat  Lab results: EKG 12/18/22 COVID test: patient states asymptomatic no test needed. Arrive at 1130 NPO after MN except clear liquids. Clear liquids from MN until 1030. Med rec completed. Medications to take morning of surgery: levothyroxine Diabetic medication: none AM of sx; hold Mounjaro 1 week prior to sx Patient instructed no nail polish to be worn day of surgery. Patient instructed to bring photo id and insurance card day of surgery. Patient aware to have driver (ride ) / caregiver for 24 hours after surgery. wife to drive. Special Instructions: NA Patient verbalized understanding of instructions that were given at this phone interview. Patient denies shortness of breath, chest pain, fever, cough at this phone interview.  Patient PCP: Dr. Drusilla Kanner, [facility]

## 2023-02-11 ENCOUNTER — Ambulatory Visit: Payer: BC Managed Care – PPO | Attending: Cardiology | Admitting: Cardiology

## 2023-02-11 ENCOUNTER — Encounter: Payer: Self-pay | Admitting: Cardiology

## 2023-02-11 VITALS — BP 112/64 | HR 91 | Ht 73.0 in | Wt 225.6 lb

## 2023-02-11 DIAGNOSIS — E1122 Type 2 diabetes mellitus with diabetic chronic kidney disease: Secondary | ICD-10-CM | POA: Diagnosis not present

## 2023-02-11 DIAGNOSIS — R0609 Other forms of dyspnea: Secondary | ICD-10-CM | POA: Insufficient documentation

## 2023-02-11 DIAGNOSIS — N183 Chronic kidney disease, stage 3 unspecified: Secondary | ICD-10-CM

## 2023-02-11 DIAGNOSIS — Z8249 Family history of ischemic heart disease and other diseases of the circulatory system: Secondary | ICD-10-CM

## 2023-02-11 DIAGNOSIS — R0989 Other specified symptoms and signs involving the circulatory and respiratory systems: Secondary | ICD-10-CM | POA: Diagnosis not present

## 2023-02-11 DIAGNOSIS — C61 Malignant neoplasm of prostate: Secondary | ICD-10-CM

## 2023-02-11 DIAGNOSIS — I129 Hypertensive chronic kidney disease with stage 1 through stage 4 chronic kidney disease, or unspecified chronic kidney disease: Secondary | ICD-10-CM

## 2023-02-11 NOTE — Patient Instructions (Signed)
Medication Instructions:  Your physician recommends that you continue on your current medications as directed. Please refer to the Current Medication list given to you today.  *If you need a refill on your cardiac medications before your next appointment, please call your pharmacy*   Lab Work: None Ordered If you have labs (blood work) drawn today and your tests are completely normal, you will receive your results only by: MyChart Message (if you have MyChart) OR A paper copy in the mail If you have any lab test that is abnormal or we need to change your treatment, we will call you to review the results.   Testing/Procedures: Your physician has requested that you have a carotid duplex. This test is an ultrasound of the carotid arteries in your neck. It looks at blood flow through these arteries that supply the brain with blood. Allow one hour for this exam. There are no restrictions or special instructions.   We will order CT coronary calcium score. It will cost $99.00 and is not covered by insurance.  Please call to schedule.    [facility] [address] [place], [place] [address] [phone]    Follow-Up: At [facility], you and your health needs are our priority.  As part of our continuing mission to provide you with exceptional heart care, we have created designated Provider Care Teams.  These Care Teams include your primary Cardiologist (physician) and Advanced Practice Providers (APPs -  Physician Assistants and Nurse Practitioners) who all work together to provide you with the care you need, when you need it.  We recommend signing up for the patient portal called "MyChart".  Sign up information is provided on this After Visit Summary.  MyChart is used to connect with patients for Virtual Visits (Telemedicine).  Patients are able to view lab/test results, encounter notes, upcoming appointments, etc.  Non-urgent messages can be sent to your provider as well.   To  learn more about what you can do with MyChart, go to [email or web address].    Your next appointment:   2 month(s)  The format for your next appointment:   In Person  Provider:   Gypsy Balsam, MD    Other Instructions NA

## 2023-02-11 NOTE — Addendum Note (Signed)
Addended by: Baldo Ash D on: 02/11/2023 03:22 PM   Modules accepted: Orders

## 2023-02-11 NOTE — Progress Notes (Signed)
Cardiology Consultation:    Date:  02/11/2023   ID:  Micheal Kerr., DOB August 13, 1960, MRN [id number]  PCP:  [name], NP  Cardiologist:  Gypsy Balsam, MD   Referring MD: [name], NP   Chief Complaint  Patient presents with   Poor leg circulation    History of Present Illness:    Micheal Cypress. is a 62 y.o. male who is being seen today for the evaluation of poor circulation in his legs at the request of [name], NP.  Past medical history significant for longstanding diabetes, 10 years his hemoglobin A1c is in the neighborhood of 7, chronic kidney disease, benign prostate hypertrophy and now recently diagnosed with prostate cancer, hyperlipidemia, does not smoke.  He was referred to [place] because he got some sophisticated test done in his lower extremities he was told to have a circulation problem with a lot of numbers that they look to ABIs however are normal.  He said he does not have any problem he can walk climb stairs with no difficulty meaning he does not have any signs and symptoms of claudication pulses distally and fairly distant.  He never had any heart trouble.  Denies having any chest pain tightness squeezing pressure burning chest.  He has been treated with statin for his hyperlipidemia.  He does not have family history of premature coronary artery disease in the matter of fact he does not have any cardiac history in his family usually his family have appropriate cancer.  He is not on any special diet he does not exercise on a regular basis but is very active  Past Medical History:  Diagnosis Date   BPH with obstruction/lower urinary tract symptoms    Chronic kidney disease    Diabetes mellitus (HCC)    Diverticulosis    Elevated PSA    Hx of colonic polyps    Hyperlipidemia    Hypertension    Hypothyroidism    Nocturia    Prostate cancer (HCC)     Past Surgical History:  Procedure Laterality Date   COLONOSCOPY     PROSTATE BIOPSY       Current Medications: Current Meds  Medication Sig   JARDIANCE 25 MG TABS tablet Take 25 mg by mouth daily.   levothyroxine (SYNTHROID) 88 MCG tablet Take 88 mcg by mouth every morning.   losartan (COZAAR) 25 MG tablet Take 25 mg by mouth daily.   metFORMIN (GLUCOPHAGE) 1000 MG tablet Take 1 tablet by mouth 2 (two) times daily.   MOUNJARO 12.5 MG/0.5ML Pen Inject 12.5 mg into the skin once a week.   rosuvastatin (CRESTOR) 10 MG tablet Take 10 mg by mouth daily.     Allergies:   Patient has no known allergies.   Social History   Socioeconomic History   Marital status: Married    Spouse name: Not on file   Number of children: 2   Years of education: Not on file   Highest education level: Not on file  Occupational History   Not on file  Tobacco Use   Smoking status: Never   Smokeless tobacco: Never  Vaping Use   Vaping status: Never Used  Substance and Sexual Activity   Alcohol use: Never   Drug use: Never   Sexual activity: Not on file  Other Topics Concern   Not on file  Social History Narrative   Not on file   Social Determinants of Health   Financial  Resource Strain: Not on file  Food Insecurity: No Food Insecurity (11/26/2022)   Hunger Vital Sign    Worried About Running Out of Food in the Last Year: Never true    Ran Out of Food in the Last Year: Never true  Transportation Needs: No Transportation Needs (11/26/2022)   PRAPARE - Administrator, Civil Service (Medical): No    Lack of Transportation (Non-Medical): No  Physical Activity: Not on file  Stress: Not on file  Social Connections: Not on file     Family History: The patient's family history includes Diabetes in his brother and father; Lung cancer in his mother. There is no history of Colon cancer, Colon polyps, Esophageal cancer, Rectal cancer, or Stomach cancer. ROS:   Please see the history of present illness.    All 14 point review of systems negative except as described per history of  present illness.  EKGs/Labs/Other Studies Reviewed:    The following studies were reviewed today:   EKG:  EKG Interpretation Date/Time:  Wednesday February 11 2023 14:48:03 EDT Ventricular Rate:  91 PR Interval:  200 QRS Duration:  90 QT Interval:  364 QTC Calculation: 447 R Axis:   91  Text Interpretation: Sinus rhythm Rightward axis Borderline ECG When compared with ECG of 18-Dec-2022 10:00, Fusion complexes are now Present Confirmed by Gypsy Balsam [phone]) on 02/11/2023 2:58:11 PM    Recent Labs: No results found for requested labs within last 365 days.  Recent Lipid Panel No results found for: "CHOL", "TRIG", "HDL", "CHOLHDL", "VLDL", "LDLCALC", "LDLDIRECT"  Physical Exam:    VS:  BP 112/64 (BP Location: Left Arm, Patient Position: Sitting)   Pulse 91   Ht 6\' 1"  (1.854 m)   Wt 225 lb 9.6 oz (102.3 kg)   SpO2 95%   BMI 29.76 kg/m     Wt Readings from Last 3 Encounters:  02/11/23 225 lb 9.6 oz (102.3 kg)  01/14/23 220 lb (99.8 kg)  12/16/22 227 lb (103 kg)     GEN:  Well nourished, well developed in no acute distress HEENT: Normal NECK: No JVD; No carotid bruits LYMPHATICS: No lymphadenopathy CARDIAC: RRR, no murmurs, no rubs, no gallops RESPIRATORY:  Clear to auscultation without rales, wheezing or rhonchi  ABDOMEN: Soft, non-tender, non-distended MUSCULOSKELETAL:  No edema; No deformity  SKIN: Warm and dry NEUROLOGIC:  Alert and oriented x 3 PSYCHIATRIC:  Normal affect   ASSESSMENT:    1. Poor circulation   2. Dyspnea on exertion   3. Malignant neoplasm of prostate (HCC)   4. Diabetes mellitus with stage 3 chronic kidney disease (HCC)   5. Benign hypertension with chronic kidney disease, stage III (HCC)    PLAN:    In order of problems listed above:  Poor circulation of lower extremities ABIs however normal the test that I review showed microvascular circulation problem which is nothing extraordinary in somebody his age.  With diabetes lasting for at  least 10 years.  The key will be control his diabetes the best possible and modify his risk factors for coronary artery disease. Because of present rating of atherosclerosis I will ask him to have carotic ultrasounds make sure he does not have any significant stenosis there.  As a part evaluation I will ask him to have coronary calcium score to see if we need to be more aggressive in evaluating him for presence of obstructive coronary artery disease even though he does not have any typical symptomatology at the same time  however he is diabetic for more than 10 years and he may also have suffered from neuropathy and do not have typical symptoms.  Future recommendation will be based on results of carotic ultrasounds as well as coronary calcium score. Dyslipidemia he is taking Crestor 10 which I will continue.  I did review his lab work test done by primary care physician his LDL 64 however HDL is very low only 31. Diabetes hemoglobin A1c 7 need to be better control which she understands and trying to work on it   Medication Adjustments/Labs and Tests Ordered: Current medicines are reviewed at length with the patient today.  Concerns regarding medicines are outlined above.  Orders Placed This Encounter  Procedures   EKG 12-Lead   No orders of the defined types were placed in this encounter.   Signed, [name], MD, [facility] 02/11/2023 3:12 PM    [facility]

## 2023-02-17 ENCOUNTER — Ambulatory Visit (HOSPITAL_BASED_OUTPATIENT_CLINIC_OR_DEPARTMENT_OTHER)
Admission: RE | Admit: 2023-02-17 | Discharge: 2023-02-17 | Disposition: A | Discharge status: Home / Self Care | Payer: BC Managed Care – PPO | Source: Ambulatory Visit | Attending: Cardiology | Admitting: Cardiology

## 2023-02-17 DIAGNOSIS — Z8249 Family history of ischemic heart disease and other diseases of the circulatory system: Secondary | ICD-10-CM | POA: Insufficient documentation

## 2023-02-17 NOTE — H&P (Unsigned)
NAME: Micheal Kerr, Micheal Kerr MEDICAL RECORD NO: [id number] ACCOUNT NO: [id number] DATE OF BIRTH: 1961/04/21 PHYSICIAN: Debroah Baller, MD  History and Physical   DATE OF ADMISSION: 02/19/2023  The patient is a 62 year old white male with a diagnosis of stage T1c adenocarcinoma of the prostate and he is for radioactive seed implantation.  HISTORY OF PRESENT ILLNESS:  This is a 62 year old white male with diagnosis of prostate cancer.  He has a previous history of fluctuating PSA between 2.5-4.7.  PSA elevated to 5.9 and he underwent MRI imaging of the prostate on 10/03/2022.  Prostate  volume 36 mL. He underwent core biopsies and 7 were positive.  Maximum Gleason score was 3+4 in the right mid gland.  Gleason 3+3 was seen in the left mid, right base, right base lateral, right apex lateral and in the region of interest.  He has reviewed  his options for treatment and has opted for radioactive seed implantation.  PAST MEDICAL HISTORY:  Significant for BPH and he is on Flomax.  He does have a history of adult-onset diabetes and elevated lipids.  He has a history of hypertension and is on medication.  FAMILY HISTORY:  Significant for diabetes in his father and brother.  No history of prostate cancer.  MEDICATIONS:  At this time include Jardiance 25 mg, levothyroxine 75 mcg, lisinopril 10 mg every day, metformin 1000 mg tablet 1 tablet twice a day, Mounjaro injection and Crestor.  ALLERGIES:  He has no known drug allergies.  REVIEW OF SYSTEMS:  As previously listed but negative for any chest pain nor shortness of breath.  He denies any fever, chills or night sweats.  Bowel movements have been regular.  He denies any paresthesias or sensory deficits.  PHYSICAL EXAMINATION: GENERAL:  A well-developed, well-nourished man in no acute distress. HEENT:  The head is normocephalic.  The eyes show that the pupils are round and reactive to light. NECK:  Supple, without rigidity. BACK: Without CVA  tenderness. LUNGS:  Clear to auscultation and percussion. COR:  The heart sounds showed grade 2/6 systolic murmur.  Regular rhythm. ABDOMEN:  Nontender, without masses. GU: The penis is without lesions.  Both testes are descended. RECTAL:  Reveals adequate sphincter tone, approximately 30-40 gram prostate. EXTREMITIES:  The lower extremities are without edema nor calf tenderness. NEUROLOGIC:  Nonfocal.  Pertinent laboratories are pending.  Pathology is as noted.  Staging CT scan did not show any spread of disease.  ASSESSMENT:  Stage T1c prostate cancer.  The patient has reviewed his options for definitive therapy.  PLAN:  Will be to proceed with cystoscopy and radioactive seed implantation for definitive therapy of the patient's prostate cancer.  The risks and benefits have been reviewed and questions answered.  He verbalizes understanding and wishes to proceed.   SHW D: 02/13/2023 6:02:25 pm T: 02/13/2023 9:51:00 pm  JOB: [id number]/ [id number]

## 2023-02-18 ENCOUNTER — Telehealth: Payer: Self-pay | Admitting: *Deleted

## 2023-02-18 NOTE — Progress Notes (Signed)
Pt called and reviewed pre op instructions for 02-19-2023 surgery, fleets enema am of surgery.

## 2023-02-18 NOTE — Telephone Encounter (Signed)
Called patient to remind of procedure for 02-19-23, spoke with patient and he is aware of this procedure

## 2023-02-19 ENCOUNTER — Encounter (HOSPITAL_BASED_OUTPATIENT_CLINIC_OR_DEPARTMENT_OTHER)
Admission: RE | Disposition: A | Discharge status: Home / Self Care | Payer: Self-pay | Source: Home / Self Care | Attending: Urology

## 2023-02-19 ENCOUNTER — Ambulatory Visit (HOSPITAL_BASED_OUTPATIENT_CLINIC_OR_DEPARTMENT_OTHER)
Admission: RE | Admit: 2023-02-19 | Discharge: 2023-02-19 | Disposition: A | Discharge status: Home / Self Care | Payer: BC Managed Care – PPO | Attending: Urology | Admitting: Urology

## 2023-02-19 ENCOUNTER — Ambulatory Visit (HOSPITAL_BASED_OUTPATIENT_CLINIC_OR_DEPARTMENT_OTHER): Payer: BC Managed Care – PPO | Admitting: Certified Registered"

## 2023-02-19 ENCOUNTER — Other Ambulatory Visit: Payer: Self-pay

## 2023-02-19 ENCOUNTER — Ambulatory Visit (HOSPITAL_COMMUNITY): Payer: BC Managed Care – PPO

## 2023-02-19 ENCOUNTER — Encounter (HOSPITAL_BASED_OUTPATIENT_CLINIC_OR_DEPARTMENT_OTHER): Payer: Self-pay | Admitting: Urology

## 2023-02-19 DIAGNOSIS — I10 Essential (primary) hypertension: Secondary | ICD-10-CM | POA: Insufficient documentation

## 2023-02-19 DIAGNOSIS — Z79890 Hormone replacement therapy (postmenopausal): Secondary | ICD-10-CM | POA: Insufficient documentation

## 2023-02-19 DIAGNOSIS — E785 Hyperlipidemia, unspecified: Secondary | ICD-10-CM | POA: Diagnosis not present

## 2023-02-19 DIAGNOSIS — Z7984 Long term (current) use of oral hypoglycemic drugs: Secondary | ICD-10-CM | POA: Insufficient documentation

## 2023-02-19 DIAGNOSIS — C61 Malignant neoplasm of prostate: Secondary | ICD-10-CM | POA: Insufficient documentation

## 2023-02-19 DIAGNOSIS — N400 Enlarged prostate without lower urinary tract symptoms: Secondary | ICD-10-CM | POA: Diagnosis not present

## 2023-02-19 DIAGNOSIS — Z01818 Encounter for other preprocedural examination: Secondary | ICD-10-CM

## 2023-02-19 DIAGNOSIS — E119 Type 2 diabetes mellitus without complications: Secondary | ICD-10-CM | POA: Insufficient documentation

## 2023-02-19 HISTORY — PX: RADIOACTIVE SEED IMPLANT: SHX5150

## 2023-02-19 HISTORY — PX: CYSTOSCOPY: SHX5120

## 2023-02-19 LAB — POCT I-STAT, CHEM 8
BUN: 22 mg/dL — ABNORMAL HIGH (ref 6–20)
Calcium, Ion: 1.15 mmol/L (ref 1.15–1.40)
Chloride: 103 mmol/L (ref 98–111)
Creatinine, Ser: 1.6 mg/dL — ABNORMAL HIGH (ref 0.61–1.24)
Glucose, Bld: 136 mg/dL — ABNORMAL HIGH (ref 70–99)
HCT: 45 % (ref 39.0–52.0)
Hemoglobin: 15.3 g/dL (ref 13.0–17.0)
Potassium: 3.9 mmol/L (ref 3.5–5.1)
Sodium: 140 mmol/L (ref 135–145)
TCO2: 23 mmol/L (ref 22–32)

## 2023-02-19 LAB — GLUCOSE, CAPILLARY: Glucose-Capillary: 134 mg/dL — ABNORMAL HIGH (ref 70–99)

## 2023-02-19 SURGERY — INSERTION, RADIATION SOURCE, PROSTATE
Anesthesia: General | Site: Prostate

## 2023-02-19 MED ORDER — DEXMEDETOMIDINE HCL IN NACL 80 MCG/20ML IV SOLN
INTRAVENOUS | Status: DC | PRN
  Administered 2023-02-19 (×2): 4 ug via INTRAVENOUS

## 2023-02-19 MED ORDER — PROPOFOL 10 MG/ML IV BOLUS
INTRAVENOUS | Status: DC | PRN
Start: 2023-02-19 — End: 2023-02-19
  Administered 2023-02-19: 160 mg via INTRAVENOUS

## 2023-02-19 MED ORDER — PROPOFOL 10 MG/ML IV BOLUS
INTRAVENOUS | Status: AC
  Filled 2023-02-19: qty 20

## 2023-02-19 MED ORDER — ONDANSETRON HCL 4 MG/2ML IJ SOLN
INTRAMUSCULAR | Status: DC | PRN
  Administered 2023-02-19: 4 mg via INTRAVENOUS

## 2023-02-19 MED ORDER — FENTANYL CITRATE (PF) 100 MCG/2ML IJ SOLN
INTRAMUSCULAR | Status: AC
  Filled 2023-02-19: qty 2

## 2023-02-19 MED ORDER — PHENYLEPHRINE 80 MCG/ML (10ML) SYRINGE FOR IV PUSH (FOR BLOOD PRESSURE SUPPORT)
PREFILLED_SYRINGE | INTRAVENOUS | Status: AC
  Filled 2023-02-19: qty 10

## 2023-02-19 MED ORDER — ROCURONIUM BROMIDE 10 MG/ML (PF) SYRINGE
PREFILLED_SYRINGE | INTRAVENOUS | Status: DC | PRN
  Administered 2023-02-19: 80 mg via INTRAVENOUS

## 2023-02-19 MED ORDER — LIDOCAINE HCL (PF) 2 % IJ SOLN
INTRAMUSCULAR | Status: AC
  Filled 2023-02-19: qty 5

## 2023-02-19 MED ORDER — FENTANYL CITRATE (PF) 100 MCG/2ML IJ SOLN
INTRAMUSCULAR | Status: DC | PRN
  Administered 2023-02-19: 100 ug via INTRAVENOUS

## 2023-02-19 MED ORDER — CIPROFLOXACIN IN D5W 400 MG/200ML IV SOLN
INTRAVENOUS | Status: AC
  Filled 2023-02-19: qty 200

## 2023-02-19 MED ORDER — ONDANSETRON HCL 4 MG/2ML IJ SOLN
INTRAMUSCULAR | Status: AC
  Filled 2023-02-19: qty 2

## 2023-02-19 MED ORDER — ACETAMINOPHEN 10 MG/ML IV SOLN: 1000.0000 mg | Freq: Once | INTRAVENOUS | Status: DC | PRN

## 2023-02-19 MED ORDER — OXYCODONE HCL 5 MG PO TABS: 5.0000 mg | ORAL_TABLET | Freq: Once | ORAL | Status: DC | PRN

## 2023-02-19 MED ORDER — LACTATED RINGERS IV SOLN: INTRAVENOUS | Status: DC

## 2023-02-19 MED ORDER — LIDOCAINE 2% (20 MG/ML) 5 ML SYRINGE
INTRAMUSCULAR | Status: DC | PRN
  Administered 2023-02-19: 100 mg via INTRAVENOUS

## 2023-02-19 MED ORDER — DEXAMETHASONE SODIUM PHOSPHATE 10 MG/ML IJ SOLN
INTRAMUSCULAR | Status: DC | PRN
  Administered 2023-02-19: 10 mg via INTRAVENOUS

## 2023-02-19 MED ORDER — DEXAMETHASONE SODIUM PHOSPHATE 10 MG/ML IJ SOLN
INTRAMUSCULAR | Status: AC
  Filled 2023-02-19: qty 1

## 2023-02-19 MED ORDER — CIPROFLOXACIN IN D5W 400 MG/200ML IV SOLN
400.0000 mg | INTRAVENOUS | Status: AC
  Administered 2023-02-19: 400 mg via INTRAVENOUS

## 2023-02-19 MED ORDER — ROCURONIUM BROMIDE 10 MG/ML (PF) SYRINGE
PREFILLED_SYRINGE | INTRAVENOUS | Status: AC
  Filled 2023-02-19: qty 10

## 2023-02-19 MED ORDER — IOHEXOL 300 MG/ML  SOLN
INTRAMUSCULAR | Status: DC | PRN
  Administered 2023-02-19: 7 mL

## 2023-02-19 MED ORDER — PHENYLEPHRINE 80 MCG/ML (10ML) SYRINGE FOR IV PUSH (FOR BLOOD PRESSURE SUPPORT)
PREFILLED_SYRINGE | INTRAVENOUS | Status: DC | PRN
  Administered 2023-02-19 (×3): 160 ug via INTRAVENOUS

## 2023-02-19 MED ORDER — MIDAZOLAM HCL 5 MG/5ML IJ SOLN
INTRAMUSCULAR | Status: DC | PRN
  Administered 2023-02-19: 2 mg via INTRAVENOUS

## 2023-02-19 MED ORDER — FENTANYL CITRATE (PF) 100 MCG/2ML IJ SOLN: 25.0000 ug | INTRAMUSCULAR | Status: DC | PRN

## 2023-02-19 MED ORDER — ONDANSETRON HCL 4 MG/2ML IJ SOLN: 4.0000 mg | Freq: Once | INTRAMUSCULAR | Status: DC | PRN

## 2023-02-19 MED ORDER — MIDAZOLAM HCL 2 MG/2ML IJ SOLN
INTRAMUSCULAR | Status: AC
  Filled 2023-02-19: qty 2

## 2023-02-19 MED ORDER — OXYCODONE HCL 5 MG/5ML PO SOLN: 5.0000 mg | Freq: Once | ORAL | Status: DC | PRN

## 2023-02-19 MED ORDER — STERILE WATER FOR IRRIGATION IR SOLN
Status: DC | PRN
  Administered 2023-02-19: 3 mL

## 2023-02-19 SURGICAL SUPPLY — 34 items
BLADE CLIPPER SENSICLIP SURGIC (BLADE) ×2 IMPLANT
Bard QuickLink Cartridges with BrachySource I-125 IMPLANT
CATH FOLEY 2WAY SLVR 5CC 16FR (CATHETERS) ×4 IMPLANT
CATH ROBINSON RED A/P 20FR (CATHETERS) ×2 IMPLANT
COVER BACK TABLE 60X90IN (DRAPES) ×2 IMPLANT
COVER MAYO STAND STRL (DRAPES) ×2 IMPLANT
DRSG TEGADERM 4X4.75 (GAUZE/BANDAGES/DRESSINGS) ×2 IMPLANT
DRSG TEGADERM 8X12 (GAUZE/BANDAGES/DRESSINGS) ×2 IMPLANT
GAUZE SPONGE 4X4 12PLY STRL (GAUZE/BANDAGES/DRESSINGS) IMPLANT
GLOVE BIO SURGEON STRL SZ 6.5 (GLOVE) ×2 IMPLANT
GLOVE BIO SURGEON STRL SZ7.5 (GLOVE) ×4 IMPLANT
GLOVE BIO SURGEON STRL SZ8 (GLOVE) IMPLANT
GLOVE BIOGEL PI IND STRL 6.5 (GLOVE) IMPLANT
GLOVE SURG ORTHO 8.5 STRL (GLOVE) IMPLANT
GOWN STRL REUS W/TWL LRG LVL3 (GOWN DISPOSABLE) ×2 IMPLANT
GRID BRACH TEMP 18GA 2.8X3X.75 (MISCELLANEOUS) ×2 IMPLANT
HOLDER FOLEY CATH W/STRAP (MISCELLANEOUS) ×2 IMPLANT
IV NS 1000ML (IV SOLUTION) ×2
IV NS 1000ML BAXH (IV SOLUTION) ×2 IMPLANT
KIT TURNOVER CYSTO (KITS) ×2 IMPLANT
NDL BRACHY 18G 5PK (NEEDLE) ×8 IMPLANT
NDL BRACHY 18G SINGLE (NEEDLE) IMPLANT
NDL PK MORGANSTERN STABILIZ (NEEDLE) ×2 IMPLANT
NEEDLE BRACHY 18G 5PK (NEEDLE) ×8 IMPLANT
NEEDLE BRACHY 18G SINGLE (NEEDLE) IMPLANT
NEEDLE PK MORGANSTERN STABILIZ (NEEDLE) ×2 IMPLANT
PACK CYSTO (CUSTOM PROCEDURE TRAY) ×2 IMPLANT
SHEATH ULTRASOUND LF (SHEATH) IMPLANT
SHEATH ULTRASOUND LTX NONSTRL (SHEATH) IMPLANT
SLEEVE SCD COMPRESS KNEE MED (STOCKING) ×2 IMPLANT
SYR 10ML LL (SYRINGE) ×2 IMPLANT
TOWEL OR 17X24 6PK STRL BLUE (TOWEL DISPOSABLE) ×2 IMPLANT
UNDERPAD 30X36 HEAVY ABSORB (UNDERPADS AND DIAPERS) ×4 IMPLANT
WATER STERILE IRR 500ML POUR (IV SOLUTION) ×2 IMPLANT

## 2023-02-19 NOTE — Op Note (Signed)
NAME: Micheal Kerr, Micheal Kerr MEDICAL RECORD NO: [id number] ACCOUNT NO: [id number] DATE OF BIRTH: 1960-10-15 FACILITY: [facility] LOCATION: [facility] PHYSICIAN: Debroah Baller, MD  Operative Report   DATE OF PROCEDURE: 02/19/2023  PREOPERATIVE DIAGNOSIS: Stage T1c adenocarcinoma of the prostate.  POSTOPERATIVE DIAGNOSIS: Stage T1c adenocarcinoma of the prostate.  PROCEDURE: Transrectal ultrasound of the prostate, radioactive intraprostatic seed implantation and cystoscopy.  SURGEONS OF RECORD:  Debroah Baller, MD and Dr. Margaretmary Dys.  HISTORY OF PRESENT ILLNESS:  The patient is a 62 year old white male with a recent diagnosis of prostate cancer.  He has been presented his options and has opted for definitive therapy with radioactive seed implantation.  DESCRIPTION OF PROCEDURE: The patient was brought into the operating room and placed on the table in the supine position.  After adequate general anesthesia was achieved, the Foley catheter was placed to drain the bladder and then the patient was  carefully positioned in exaggerated lithotomy with the Yellofin stirrups.  The scrotum was then shaved, elevated and taped in place onto the lower abdomen.  The perineum was shaved, prepped and draped for the performance of the procedure.  A red rubber  catheter was then placed into the rectum to relieve excess air.  Transrectal rectal ultrasound probe was then placed and prostatic anchors placed.  The prostate was then scanned from apex to seminal vesicles.  The scanned images went into the computer  program.  The individual images were then reviewed by myself and Dr. Kathrynn Running and prostatic borders, urethra and rectum were appropriately marked.  The computer program then calculated seed placement.  All of the individual images were then reviewed by  myself and Dr. Kathrynn Running to ascertain seed placement.  Once we had good coverage of the prostate with sparing of the urethra and rectum seed implantation was begun.   The bladder neck was identified with contrast in the Foley balloon and noted to be in  place.  Needles were then placed from anterior to posterior on the prostate to encompass the prostate in its entirety.  No portion of the prostate was left untreated.  Once the final seeds were placed the anchor needles were removed and pelvic x-ray was  obtained showing good distribution of the seeds.  Foley catheter was then removed.  The patient was then reprepped and draped and flexible cystoscopy performed, which showed no seeds within the bladder.  Rectal exam was then performed and this did not  reveal any foreign bodies in the rectum.  A total of 60 seeds were implanted into the prostate.  Under sterile conditions, a 16 French Foley was then placed to drain the bladder.  The patient tolerated all this well.  He was then awakened and taken to  the recovery room in good condition.   PUS D: 02/19/2023 3:30:51 pm T: 02/19/2023 6:22:00 pm  JOB: [id number]/ [id number]

## 2023-02-19 NOTE — Discharge Instructions (Addendum)
Post Anesthesia Home Care Instructions  Activity: Get plenty of rest for the remainder of the day. A responsible individual must stay with you for 24 hours following the procedure.  For the next 24 hours, DO NOT: -Drive a car -Operate machinery -Drink alcoholic beverages -Take any medication unless instructed by your physician -Make any legal decisions or sign important papers.  Meals: Start with liquid foods such as gelatin or soup. Progress to regular foods as tolerated. Avoid greasy, spicy, heavy foods. If nausea and/or vomiting occur, drink only clear liquids until the nausea and/or vomiting subsides. Call your physician if vomiting continues.  Special Instructions/Symptoms: Your throat may feel dry or sore from the anesthesia or the breathing tube placed in your throat during surgery. If this causes discomfort, gargle with warm salt water. The discomfort should disappear within 24 hours.  PROSTATE CANCER TREATMENT WITH RADIOACTIVE IODINE-125 SEED IMPLANT  This instruction sheet is intended to discuss implantation of Iodine-125 seeds as treatment for cancer of the prostate. It will explain in detail what you may expect from this treatment and what precautions are necessary as a result of the treatment. Iodine-125 emits a relatively low energy radiation. The radioactive seeds are surgically implanted directly into the prostate gland. Most of the radiation is contained within the prostate gland. A very small amount is present outside the body.The precautions that we ask you to take are to ensure that those around you are protected from unnecessary radiation. The principles of radiation safety that you need to understand are:  DISTANCE: The further a person is from the radioactive implant the less radiation they will be receiving. The amount of radiation received falls off quite rapidly with distance. More specific guidelines are given in the table on the last page.  TIME: The amount of  radiation a person is exposed to is directly proportional to the amount of time that is spent in close proximity to the radioactive implant. Very little radiation will be received during short periods. See the table on the last page for more specific guideline.  CHILDREN UNDER AGE 18 Children should not be allowed to sit on your lap or otherwise be in very close contact for more than a few minutes for the first 6-8 weeks following the implant. You may affectionately greet (hug/kiss) a child for a short period of time, but remember, the longer you are in close proximity with that child the more radiation they are being exposed to. At a distance of 6 feet there is no limit to the length of time you may spend together. See specific guidelines on the last page.  PREGNANT OR POSSIBLY PREGNANT WOMEN Pregnant women should avoid prolonged close physical contact with you for the first 6-8 weeks after implant. At a distance of 6 feet there is no limit to the length of time you may spend together. Pregnant women or possibly pregnant women can safely be in close contact with you for a limited period of time. See the last page for guidelines.  FAMILY RELATIONS You may sleep in the same bed as your partner (provided she is not pregnant or under the age of 45). Sexual intercourse, using a condom, may be resumed 2 weeks after the implant. Your semen may be discolored, dark brown or black. This is normal and is the result of bleeding that may have occurred during the implant. After 3-4 weeks it will not be necessary to use a condom.  DAILY ACTIVITIES You may resume normal activities in a   few days (example: work, shopping, church) without the risk of harmful radiation exposure to those around you provided you keep in mind the time and distance precautions. Objects that you touch or item that you use do not become radioactive. Linens, clothing, tableware, and dishes may be used by other persons without special  precautions. Your bodily wastes (urine and stool) are not radioactive.  SPECIAL PRECAUTIONS It is possible to lose implanted Iodine-125 seed(s) through urination. Although it is possible to pass seeds indefinitely, it is most likely to occur immediately after catheter removal. To prevent this from happening the catheter that was in place during the implant procedure is removed immediately after the implant and a cystoscopy procedure is performed. The process of removing the catheter and the cystoscopy procedure should dislodge and remove any seeds that are not firmly imbedded in the prostate tissue. However, you should watch for seeds if/when you remove your catheter at home. The seeds are silver colored and the size of a grain of rice. In the unlikely event that a seed is seen after urination, simply flush the seed down the toilet. The seed should not be handled with your fingers, not even with a glove or napkin. A spoon or tweezers can be used to pick up a seed. The Radiation Oncology department is open Monday - Friday from 8:00 am to 5:30 pm with a Radiation Oncologist on call at all times. He or she may be reached by calling [phone]. If you are to be hospitalized or if death should occur, your family should notify the Radiation Safety officer.  SIDE EFFECTS There are very few side effects associate with the implant procedure. Minor burning with urination, weak stream, hesitancy, intermittency, frequency, mild pain or feeling unable to pass your urine freely are common and usually stop in one to four months. If these symptoms are extremely uncomfortable, contact your physician.  RADIATION SAFETY GUIDELINES PROSTATE CANCER TREATMENT WITH RADIOACTIVE IODINE-125 SEED IMPLANT  The following guidelines will limit exposure to less than naturally occurring background radiation.  PERSONS AGE 18-45 (if able to become pregnant)  FOR 8 WEEKS FOLLOWING IMPLANT  At a distance of 1 foot: limit time to  less than 2 hours/week At a distance of 3 feet: limit time to 20 hours/week At a distance of 6 feet: no restrictions  AFTER 8 WEEKS No restrictions  CHILDREN UNDER AGE 18, PREGNANT WOMEN OR POSSIBLY PREGNANT WOMEN  FOR 8 WEEKS FOLLOWING IMPLANT At a distance of 1 foot: limit time to 10 minutes/week At a distance of 3 feet: limit time to 2 hours/week At a distance of 6 feet: no restrictions  AFTER 8 WEEKS No restrictions  PERSONS OVER THE AGE OF 45 AND DO NOT EXPECT TO HAVE ANY MORE CHILDREN No restrictions  Updated by [facility] in January 2020 

## 2023-02-19 NOTE — Brief Op Note (Signed)
02/19/2023  3:24 PM  PATIENT:  Micheal Guild Giannetti Jr.  62 y.o. male  PRE-OPERATIVE DIAGNOSIS:  prostate cancer  POST-OPERATIVE DIAGNOSIS:  prostate cancer  PROCEDURE:  Procedure(s) with comments: RADIOACTIVE SEED IMPLANT/BRACHYTHERAPY IMPLANT (N/A) CYSTOSCOPY FLEXIBLE - No seeds detected in bladder per Dr. Saddie Benders  SURGEON:  Surgeons and Role:    * Debroah Baller, MD - Primary    * Margaretmary Dys, MD  PHYSICIAN ASSISTANT:   ASSISTANTS: none   ANESTHESIA:   general  EBL:      BLOOD ADMINISTERED:none  DRAINS: Urinary Catheter (Foley)   LOCAL MEDICATIONS USED:  NONE  SPECIMEN:  No Specimen  DISPOSITION OF SPECIMEN:  N/A  COUNTS:  YES  TOURNIQUET:  * No tourniquets in log *  DICTATION: .[id number]  PLAN OF CARE: Discharge to home after PACU  PATIENT DISPOSITION:  PACU - hemodynamically stable.   Delay start of Pharmacological VTE agent (>24hrs) due to surgical blood loss or risk of bleeding: not applicable

## 2023-02-19 NOTE — Interval H&P Note (Signed)
History and Physical Interval Note:  02/19/2023 2:02 PM  Micheal Guild Halbur Jr.  has presented today for surgery, with the diagnosis of prostate cancer.  The various methods of treatment have been discussed with the patient and family. After consideration of risks, benefits and other options for treatment, the patient has consented to  Procedure(s): RADIOACTIVE SEED IMPLANT/BRACHYTHERAPY IMPLANT (N/A) as a surgical intervention.  The patient's history has been reviewed, patient examined, no change in status, stable for surgery.  I have reviewed the patient's chart and labs.  Questions were answered to the patient's satisfaction.     Micheal Kerr

## 2023-02-19 NOTE — Anesthesia Procedure Notes (Signed)
Procedure Name: Intubation Date/Time: 02/19/2023 2:11 PM  Performed by: Manjot Beumer D, CRNAPre-anesthesia Checklist: Patient identified, Emergency Drugs available, Suction available and Patient being monitored Patient Re-evaluated:Patient Re-evaluated prior to induction Oxygen Delivery Method: Circle system utilized Preoxygenation: Pre-oxygenation with 100% oxygen Induction Type: IV induction Ventilation: Mask ventilation without difficulty Laryngoscope Size: Mac and 4 Grade View: Grade I Tube type: Oral Tube size: 7.5 mm Number of attempts: 1 Airway Equipment and Method: Stylet and Oral airway Placement Confirmation: ETT inserted through vocal cords under direct vision, positive ETCO2 and breath sounds checked- equal and bilateral Secured at: 22 cm Tube secured with: Tape Dental Injury: Teeth and Oropharynx as per pre-operative assessment

## 2023-02-19 NOTE — Transfer of Care (Signed)
Immediate Anesthesia Transfer of Care Note  Patient: Micheal Kerr.  Procedure(s) Performed: RADIOACTIVE SEED IMPLANT/BRACHYTHERAPY IMPLANT (Prostate) CYSTOSCOPY FLEXIBLE (Bladder)  Patient Location: PACU  Anesthesia Type:General  Level of Consciousness: awake, alert , and oriented  Airway & Oxygen Therapy: Patient Spontanous Breathing and Patient connected to nasal cannula oxygen  Post-op Assessment: Report given to RN and Post -op Vital signs reviewed and stable  Post vital signs: Reviewed and stable  Last Vitals:  Vitals Value Taken Time  BP    Temp    Pulse    Resp    SpO2      Last Pain:  Vitals:   02/19/23 1152  TempSrc: Oral  PainSc: 0-No pain      Patients Stated Pain Goal: 4 (02/19/23 1152)  Complications: No notable events documented.

## 2023-02-19 NOTE — Progress Notes (Signed)
Radiation Oncology         [phone] ________________________________  Name: Micheal Kerr. MRN: [id number]  Date: 02/19/2023  DOB: July 06, 1961       Prostate Seed Implant  CC:Micheal Emery, NP  No ref. provider found  DIAGNOSIS:  62 y.o. gentleman with Stage T1c adenocarcinoma of the prostate with Gleason score of 3+4, and PSA of 5.2.   Oncology History  Malignant neoplasm of prostate (HCC)  10/03/2022 Cancer Staging   Staging form: Prostate, AJCC 8th Edition - Clinical stage from 10/03/2022: Stage IIB (cT1c, cN0, cM0, PSA: 5.2, Grade Group: 2) - Signed by Micheal Fennel, PA-C on 11/25/2022 Histopathologic type: Adenocarcinoma, NOS Stage prefix: Initial diagnosis Prostate specific antigen (PSA) range: Less than 10 Gleason primary pattern: 3 Gleason secondary pattern: 4 Gleason score: 7 Histologic grading system: 5 grade system Number of biopsy cores examined: 15 Number of biopsy cores positive: 7 Location of positive needle core biopsies: Both sides   11/25/2022 Initial Diagnosis   Malignant neoplasm of prostate (HCC)       ICD-10-CM   1. Pre-op testing  Z01.818 CBG per Guidelines for Diabetes Management for Patients Undergoing Surgery ([facility], [facility], and [facility] only)    CBG per protocol    I-Stat, Chem 8 on day of surgery per protocol    CBG per Guidelines for Diabetes Management for Patients Undergoing Surgery ([facility], [facility], and [facility] only)    CBG per protocol    I-Stat, Chem 8 on day of surgery per protocol    CANCELED: EKG 12 lead per protocol      PROCEDURE: Insertion of radioactive I-125 seeds into the prostate gland.  RADIATION DOSE: 145 Gy, definitive therapy.  TECHNIQUE: Micheal Kerr. was brought to the operating room with the urologist. He was placed in the dorsolithotomy position. He was catheterized and a rectal tube was inserted. The perineum was shaved, prepped and draped. The ultrasound probe was then introduced by me into the rectum to see the prostate  gland.  TREATMENT DEVICE: I attached the needle grid to the ultrasound probe stand and anchor needles were placed.  3D PLANNING: The prostate was imaged in 3D using a sagittal sweep of the prostate probe. These images were transferred to the planning computer. There, the prostate, urethra and rectum were defined on each axial reconstructed image. Then, the software created an optimized 3D plan and a few seed positions were adjusted. The quality of the plan was reviewed using Micheal Kerr information for the target and the following two organs at risk:  Urethra and Rectum.  Then the accepted plan was printed and handed off to the radiation therapist.  Under my supervision, the custom loading of the seeds and spacers was carried out using the quick loader.  These pre-loaded needles were then placed into the needle holder.Marland Kitchen  PROSTATE VOLUME STUDY:  Using transrectal ultrasound the volume of the prostate was verified to be 32 cc.  SPECIAL TREATMENT PROCEDURE/SUPERVISION AND HANDLING: The pre-loaded needles were then delivered by the urologist under sagittal guidance. A total of 19 needles were used to deposit 60 seeds in the prostate gland. The individual seed activity was 0.433 mCi.  SpaceOAR:  No  COMPLEX SIMULATION: At the end of the procedure, an anterior radiograph of the pelvis was obtained to document seed positioning and count. Cystoscopy was performed by the urologist to check the urethra and bladder.  MICRODOSIMETRY: At the end of the procedure, the patient was emitting 0.07 mR/hr at 1  meter. Accordingly, he was considered safe for hospital discharge.  PLAN: The patient will return to the radiation oncology clinic for post implant CT dosimetry in three weeks.   ________________________________  Micheal Kerr, M.D.

## 2023-02-19 NOTE — Anesthesia Preprocedure Evaluation (Addendum)
Anesthesia Evaluation  Patient identified by MRN, date of birth, ID band Patient awake    Reviewed: Allergy & Precautions, NPO status , Patient's Chart, lab work & pertinent test results, reviewed documented beta blocker date and time   History of Anesthesia Complications Negative for: history of anesthetic complications  Airway Mallampati: IV  TM Distance: >3 FB Neck ROM: Full    Dental no notable dental hx.    Pulmonary neg pneumonia , neg COPD   breath sounds clear to auscultation       Cardiovascular Exercise Tolerance: Good hypertension, (-) angina (-) CAD, (-) Past MI, (-) Cardiac Stents, (-) CABG and (-) CHF (-) Valvular Problems/Murmurs Rhythm:Regular Rate:Normal     Neuro/Psych neg Headaches, neg Seizures    GI/Hepatic   Endo/Other  diabetes, Type 2, Insulin DependentHypothyroidism    Renal/GU CRFRenal disease     Musculoskeletal   Abdominal   Peds  Hematology   Anesthesia Other Findings   Reproductive/Obstetrics                             Anesthesia Physical Anesthesia Plan  ASA: 2  Anesthesia Plan: General   Post-op Pain Management:    Induction: Intravenous  PONV Risk Score and Plan: 1 and Treatment may vary due to age or medical condition  Airway Management Planned: Oral ETT and Video Laryngoscope Planned  Additional Equipment:   Intra-op Plan:   Post-operative Plan: Extubation in OR  Informed Consent: I have reviewed the patients History and Physical, chart, labs and discussed the procedure including the risks, benefits and alternatives for the proposed anesthesia with the patient or authorized representative who has indicated his/her understanding and acceptance.     Dental advisory given  Plan Discussed with: CRNA and Surgeon  Anesthesia Plan Comments:        Anesthesia Quick Evaluation

## 2023-02-20 ENCOUNTER — Encounter (HOSPITAL_BASED_OUTPATIENT_CLINIC_OR_DEPARTMENT_OTHER): Payer: Self-pay | Admitting: Urology

## 2023-02-22 NOTE — Anesthesia Postprocedure Evaluation (Signed)
Anesthesia Post Note  Patient: Suhaib Guzzo.  Procedure(s) Performed: RADIOACTIVE SEED IMPLANT/BRACHYTHERAPY IMPLANT (Prostate) CYSTOSCOPY FLEXIBLE (Bladder)     Patient location during evaluation: PACU Anesthesia Type: General Level of consciousness: awake and alert Pain management: pain level controlled Vital Signs Assessment: post-procedure vital signs reviewed and stable Respiratory status: spontaneous breathing, nonlabored ventilation, respiratory function stable and patient connected to nasal cannula oxygen Cardiovascular status: blood pressure returned to baseline and stable Postop Assessment: no apparent nausea or vomiting Anesthetic complications: no   No notable events documented.  Last Vitals:  Vitals:   02/19/23 1545 02/19/23 1600  BP: 128/81 121/79  Pulse: 76 77  Resp: 19 19  Temp:  36.6 C  SpO2: 93% 93%    Last Pain:  Vitals:   02/20/23 1056  TempSrc:   PainSc: 1                  Collene Schlichter

## 2023-02-24 ENCOUNTER — Telehealth: Payer: Self-pay | Admitting: Cardiology

## 2023-02-24 ENCOUNTER — Telehealth: Payer: Self-pay

## 2023-02-24 NOTE — Telephone Encounter (Signed)
Patient returned staff call regarding results.

## 2023-02-24 NOTE — Telephone Encounter (Signed)
LM to return my call.  ?

## 2023-02-24 NOTE — Telephone Encounter (Signed)
-----   Message from Gypsy Balsam sent at 02/19/2023  1:11 PM EDT ----- Calcium score moderately elevated at 114, will talk details during the visit

## 2023-02-24 NOTE — Telephone Encounter (Signed)
[name], MD 02/19/2023  1:11 PM EDT     Calcium score moderately elevated at 114, will talk details during the visit    The patient has been notified of the result and verbalized understanding.  All questions (if any) were answered. Frutoso Schatz, RN 02/24/2023 5:03 PM

## 2023-02-26 ENCOUNTER — Telehealth: Payer: Self-pay

## 2023-02-26 NOTE — Telephone Encounter (Signed)
CT Score Results reviewed with pt as per Dr. Vanetta Shawl note.  Pt verbalized understanding and had no additional questions. Routed to PCP

## 2023-03-11 ENCOUNTER — Telehealth: Payer: Self-pay | Admitting: *Deleted

## 2023-03-11 ENCOUNTER — Ambulatory Visit: Payer: BC Managed Care – PPO | Attending: Cardiology

## 2023-03-11 DIAGNOSIS — R0989 Other specified symptoms and signs involving the circulatory and respiratory systems: Secondary | ICD-10-CM

## 2023-03-11 NOTE — Telephone Encounter (Signed)
Called patient to remind of post seed appts. for 03-12-23, spoke with patient and he is aware of rhese appts

## 2023-03-11 NOTE — Progress Notes (Signed)
  Radiation Oncology         [phone]) [phone] ________________________________  Name: Micheal Kerr. MRN: [id number]  Date: 03/12/2023  DOB: [birth year]  COMPLEX SIMULATION NOTE  NARRATIVE:  The patient was brought to the CT Simulation planning suite today following prostate seed implantation approximately one month ago.  Identity was confirmed.  All relevant records and images related to the planned course of therapy were reviewed.  Then, the patient was set-up supine.  CT images were obtained.  The CT images were loaded into the planning software.  Then the prostate and rectum were contoured.  Treatment planning then occurred.  The implanted iodine 125 seeds were identified by the physics staff for projection of radiation distribution  I have requested : 3D Simulation  I have requested a DVH of the following structures: Prostate and rectum.    ________________________________  Artist Pais Kathrynn Running, M.D.

## 2023-03-12 ENCOUNTER — Ambulatory Visit
Admission: RE | Admit: 2023-03-12 | Discharge: 2023-03-12 | Disposition: A | Discharge status: Home / Self Care | Payer: BC Managed Care – PPO | Source: Ambulatory Visit | Attending: Radiation Oncology | Admitting: Radiation Oncology

## 2023-03-12 ENCOUNTER — Encounter: Payer: Self-pay | Admitting: Urology

## 2023-03-12 ENCOUNTER — Encounter: Payer: Self-pay | Admitting: Radiation Oncology

## 2023-03-12 ENCOUNTER — Ambulatory Visit
Admission: RE | Admit: 2023-03-12 | Discharge: 2023-03-12 | Disposition: A | Discharge status: Home / Self Care | Payer: BC Managed Care – PPO | Source: Ambulatory Visit | Attending: Urology | Admitting: Urology

## 2023-03-12 VITALS — BP 127/66 | HR 76 | Temp 97.9°F | Resp 20 | Ht 73.0 in | Wt 224.0 lb

## 2023-03-12 DIAGNOSIS — C61 Malignant neoplasm of prostate: Secondary | ICD-10-CM | POA: Insufficient documentation

## 2023-03-12 NOTE — Progress Notes (Signed)
Post-seed nursing interview for a diagnosis of Stage T1c adenocarcinoma of the prostate with Gleason score of 3+4, and PSA of 5.2.  Patient identity verified x2. Patient reports polyuria but is doing extremely well. No other issues conveyed at this time.  Meaningful use complete.  I-PSS score- 16 - Moderate SHIM score- 16 Urinary Management medication(s) None  Urology appointment date- Pending with Dr. Saddie Benders at [facility]   Vitals- BP 127/66 (BP Location: Right Arm, Patient Position: Sitting, Cuff Size: Large)   Pulse 76   Temp 97.9 F (36.6 C) (Oral)   Resp 20   Ht 6\' 1"  (1.854 m)   Wt 224 lb (101.6 kg)   SpO2 98%   BMI 29.55 kg/m    This concludes the interaction.  Ruel Favors, LPN

## 2023-03-12 NOTE — Progress Notes (Signed)
  Radiation Oncology         [phone]) [phone] ________________________________  Name: Micheal Kerr. MRN: [id number]  Date: 03/12/2023  DOB: 1960/07/29  3D Planning Note   Prostate Brachytherapy Post-Implant Dosimetry  Diagnosis:    62 y.o. gentleman with Stage T1c adenocarcinoma of the prostate with Gleason score of 3+4, and PSA of 5.2.   Narrative: On a previous date, Micheal Petithomme. returned following prostate seed implantation for post implant planning. He underwent CT scan complex simulation to delineate the three-dimensional structures of the pelvis and demonstrate the radiation distribution.  Since that time, the seed localization, and complex isodose planning with dose volume histograms have now been completed.  Results:   Prostate Coverage - The dose of radiation delivered to the 90% or more of the prostate gland (D90) was 112.47% of the prescription dose. This exceeds our goal of greater than 90%. Rectal Sparing - The volume of rectal tissue receiving the prescription dose or higher was 0.04 cc. This falls under our thresholds tolerance of 1.0 cc.  Impression: The prostate seed implant appears to show adequate target coverage and appropriate rectal sparing.  Plan:  The patient will continue to follow with urology for ongoing PSA determinations. I would anticipate a high likelihood for local tumor control with minimal risk for rectal morbidity.  ________________________________  Artist Pais Kathrynn Running, M.D.

## 2023-03-12 NOTE — Radiation Completion Notes (Addendum)
  Radiation Oncology         [phone]) [phone] ________________________________  Name: Micheal Kerr. MRN: [id number]  Date: 03/12/2023  DOB: 1960-09-12  Referring Physician: GERMAIN BROTHERS, M.D. Date of Service: 2023-03-12 Radiation Oncologist: Adina Barge, M.D. [facility]    RADIATION ONCOLOGY END OF TREATMENT NOTE     Diagnosis:61 y.o. gentleman with Stage T1c adenocarcinoma of the prostate with Gleason score of 3+4, and PSA of 5.2.   Intent: Curative     ==========DELIVERED PLANS==========  Prostate Seed Implant Date: 2023-02-19   Plan Name: Prostate Seed Implant Site: Prostate Technique: Radioactive Seed Implant I-125 Mode: Brachytherapy Dose Per Fraction: 145 Gy Prescribed Dose (Delivered / Prescribed): 145 Gy / 145 Gy Prescribed Fxs (Delivered / Prescribed): 1 / 1     ==========ON TREATMENT VISIT DATES========== 2023-02-19     The patient will receive a call in about one month from the radiation oncology department. He will continue follow up with his urologist, Dr. BROTHERS, as well.  ------------------------------------------------   Donnice Barge, MD [facility]  Radiation Oncology Direct Dial: [phone]  Fax: [phone] [email or web address]  Skype  LinkedIn

## 2023-03-20 ENCOUNTER — Telehealth: Payer: Self-pay

## 2023-03-20 NOTE — Telephone Encounter (Signed)
 [place] Results reviewed with pt as per Dr. Vanetta Shawl note.  Pt verbalized understanding and had no additional questions. Routed to PCP

## 2023-04-15 NOTE — Progress Notes (Signed)
Cardiology Office Note:  .   Date:  04/17/2023  ID:  Micheal Kerr., DOB 05-20-1961, MRN [id number] PCP: [name], NP  [facility] Providers Cardiologist:  None    History of Present Illness: Micheal Kerr   [name]. is a 62 y.o. male with a past medical history of elevated coronary calcium score, DM 2, CKD, BPH, prostate cancer, dyslipidemia.    Establish care with Dr. Bing Matter on 02/11/2023 at the behest of his PCP for circulatory issues.  He underwent a coronary calcium score which was 114 placing him in the 70th percentile, aortic atherosclerosis and aortic valve sclerosis was noted.  He underwent a carotid ultrasound on 03/11/2023 which revealed right ICA mild stenosis, no evidence of stenosis in the left ICA.  He presents today for follow-up of his recent testing as outlined above.  He offers no formal complaints, he denies chest pain, palpitations, shortness of breath, syncope, dizziness, pedal edema.  He stays very active at work, he owns his own [employer] and is on his feet throughout the day, also does intentional walking.  Most recent A1c was 6.9 with his PCP.  He is lost approximately 40 pounds over the last year or so.   Review of Systems  All other systems reviewed and are negative.   Risk Assessment/Calculations:             Physical Exam:   VS:  BP 120/63 (BP Location: Right Arm, Patient Position: Sitting, Cuff Size: Normal)   Pulse 88   Ht 6\' 1"  (1.854 m)   Wt 225 lb (102.1 kg)   SpO2 95%   BMI 29.69 kg/m    Wt Readings from Last 3 Encounters:  04/17/23 225 lb (102.1 kg)  03/12/23 224 lb (101.6 kg)  02/19/23 225 lb (102.1 kg)    GEN: Well nourished, well developed in no acute distress NECK: No JVD; No carotid bruits CARDIAC: RRR, no murmurs, rubs, gallops RESPIRATORY:  Clear to auscultation without rales, wheezing or rhonchi  ABDOMEN: Soft, non-tender, non-distended EXTREMITIES:  No edema; No deformity   ASSESSMENT AND PLAN: .    Elevated coronary artery calcium score-was 114, placing him in the 70th percentile.  He is asymptomatic.  Very physically active at work. Stable with no anginal symptoms. No indication for ischemic evaluation.  Heart healthy diet and regular cardiovascular exercise encouraged.  Will repeat FLP and LFTs, he may need better control of his cholesterol. He will start ASA 81 mg daily after upcoming seed implantation.   HTN -blood pressure is well-controlled at 120/63, continue losartan 25 mg daily.  Dyslipidemia-currently on Crestor 10 mg daily, again repeating FLP and LFTs, would like his LDL to be less than 70.  A1c - most recent 6.9%, currently on Jardiance 25 mg daily, metformin 1000 mg twice daily, Mounjaro no.       Dispo: Labs per above, return in 1 year.    Signed, Flossie Dibble, NP

## 2023-04-17 ENCOUNTER — Ambulatory Visit: Payer: BC Managed Care – PPO | Attending: Cardiology | Admitting: Cardiology

## 2023-04-17 ENCOUNTER — Encounter: Payer: Self-pay | Admitting: Cardiology

## 2023-04-17 VITALS — BP 120/63 | HR 88 | Ht 73.0 in | Wt 225.0 lb

## 2023-04-17 DIAGNOSIS — R0989 Other specified symptoms and signs involving the circulatory and respiratory systems: Secondary | ICD-10-CM | POA: Diagnosis not present

## 2023-04-17 DIAGNOSIS — I129 Hypertensive chronic kidney disease with stage 1 through stage 4 chronic kidney disease, or unspecified chronic kidney disease: Secondary | ICD-10-CM | POA: Diagnosis not present

## 2023-04-17 DIAGNOSIS — E1122 Type 2 diabetes mellitus with diabetic chronic kidney disease: Secondary | ICD-10-CM

## 2023-04-17 DIAGNOSIS — C61 Malignant neoplasm of prostate: Secondary | ICD-10-CM

## 2023-04-17 DIAGNOSIS — N183 Chronic kidney disease, stage 3 unspecified: Secondary | ICD-10-CM

## 2023-04-17 LAB — COMPREHENSIVE METABOLIC PANEL WITH GFR
ALT: 19 IU/L (ref 0–44)
AST: 12 IU/L (ref 0–40)
Albumin: 4.7 g/dL (ref 3.9–4.9)
Alkaline Phosphatase: 64 IU/L (ref 44–121)
BUN/Creatinine Ratio: 17 (ref 10–24)
BUN: 25 mg/dL (ref 8–27)
Bilirubin Total: 0.3 mg/dL (ref 0.0–1.2)
CO2: 22 mmol/L (ref 20–29)
Calcium: 9.9 mg/dL (ref 8.6–10.2)
Chloride: 103 mmol/L (ref 96–106)
Creatinine, Ser: 1.51 mg/dL — ABNORMAL HIGH (ref 0.76–1.27)
Globulin, Total: 2.4 g/dL (ref 1.5–4.5)
Glucose: 197 mg/dL — ABNORMAL HIGH (ref 70–99)
Potassium: 4.2 mmol/L (ref 3.5–5.2)
Sodium: 140 mmol/L (ref 134–144)
Total Protein: 7.1 g/dL (ref 6.0–8.5)
eGFR: 52 mL/min/1.73 — ABNORMAL LOW

## 2023-04-17 LAB — LIPID PANEL
Chol/HDL Ratio: 3.8 ratio (ref 0.0–5.0)
Cholesterol, Total: 105 mg/dL (ref 100–199)
HDL: 28 mg/dL — ABNORMAL LOW
LDL Chol Calc (NIH): 53 mg/dL (ref 0–99)
Triglycerides: 138 mg/dL (ref 0–149)
VLDL Cholesterol Cal: 24 mg/dL (ref 5–40)

## 2023-04-17 NOTE — Patient Instructions (Signed)
Medication Instructions:  Your physician recommends that you continue on your current medications as directed. Please refer to the Current Medication list given to you today.  *If you need a refill on your cardiac medications before your next appointment, please call your pharmacy*   Lab Work: Your physician recommends that you return for lab work in: Today for CMP and Fasting Lipid Panel  If you have labs (blood work) drawn today and your tests are completely normal, you will receive your results only by: MyChart Message (if you have MyChart) OR A paper copy in the mail If you have any lab test that is abnormal or we need to change your treatment, we will call you to review the results.   Testing/Procedures: NONE   Follow-Up: At [facility], you and your health needs are our priority.  As part of our continuing mission to provide you with exceptional heart care, we have created designated Provider Care Teams.  These Care Teams include your primary Cardiologist (physician) and Advanced Practice Providers (APPs -  Physician Assistants and Nurse Practitioners) who all work together to provide you with the care you need, when you need it.  We recommend signing up for the patient portal called "MyChart".  Sign up information is provided on this After Visit Summary.  MyChart is used to connect with patients for Virtual Visits (Telemedicine).  Patients are able to view lab/test results, encounter notes, upcoming appointments, etc.  Non-urgent messages can be sent to your provider as well.   To learn more about what you can do with MyChart, go to [email or web address].    Your next appointment:   1 year(s)  Provider:   Gypsy Balsam, MD    Other Instructions Once you are done with the seed implantation start taking 81mg  aspirin once daily
